# Patient Record
Sex: Female | Born: 1985
Health system: Southern US, Community
[De-identification: ages and names within clinical notes are randomized; demographics above are authoritative.]

## PROBLEM LIST (undated history)

## (undated) ENCOUNTER — Inpatient Hospital Stay (HOSPITAL_COMMUNITY): Payer: Self-pay

## (undated) DIAGNOSIS — Z789 Other specified health status: Secondary | ICD-10-CM

## (undated) DIAGNOSIS — E7800 Pure hypercholesterolemia, unspecified: Secondary | ICD-10-CM

## (undated) DIAGNOSIS — I10 Essential (primary) hypertension: Secondary | ICD-10-CM

---
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
## 2006-06-02 ENCOUNTER — Emergency Department (HOSPITAL_COMMUNITY): Admission: EM | Admit: 2006-06-02 | Discharge: 2006-06-02 | Payer: Self-pay | Admitting: Emergency Medicine

## 2006-08-08 ENCOUNTER — Emergency Department (HOSPITAL_COMMUNITY): Admission: EM | Admit: 2006-08-08 | Discharge: 2006-08-08 | Payer: Self-pay | Admitting: Emergency Medicine

## 2006-08-21 ENCOUNTER — Emergency Department (HOSPITAL_COMMUNITY): Admission: EM | Admit: 2006-08-21 | Discharge: 2006-08-21 | Payer: Self-pay | Admitting: Emergency Medicine

## 2007-02-17 ENCOUNTER — Inpatient Hospital Stay (HOSPITAL_COMMUNITY): Admission: AD | Admit: 2007-02-17 | Discharge: 2007-02-17 | Payer: Self-pay | Admitting: Obstetrics and Gynecology

## 2007-04-10 ENCOUNTER — Inpatient Hospital Stay (HOSPITAL_COMMUNITY): Admission: AD | Admit: 2007-04-10 | Discharge: 2007-04-10 | Payer: Self-pay | Admitting: Obstetrics and Gynecology

## 2007-04-14 ENCOUNTER — Inpatient Hospital Stay (HOSPITAL_COMMUNITY): Admission: AD | Admit: 2007-04-14 | Discharge: 2007-04-14 | Payer: Self-pay | Admitting: Obstetrics and Gynecology

## 2007-04-14 ENCOUNTER — Ambulatory Visit: Payer: Self-pay | Admitting: Obstetrics & Gynecology

## 2007-04-15 ENCOUNTER — Inpatient Hospital Stay (HOSPITAL_COMMUNITY): Admission: AD | Admit: 2007-04-15 | Discharge: 2007-04-16 | Payer: Self-pay | Admitting: Obstetrics and Gynecology

## 2007-04-18 ENCOUNTER — Ambulatory Visit: Payer: Self-pay | Admitting: Obstetrics and Gynecology

## 2007-04-21 ENCOUNTER — Ambulatory Visit: Payer: Self-pay | Admitting: Family Medicine

## 2007-04-22 ENCOUNTER — Inpatient Hospital Stay (HOSPITAL_COMMUNITY): Admission: AD | Admit: 2007-04-22 | Discharge: 2007-04-22 | Payer: Self-pay | Admitting: Obstetrics and Gynecology

## 2007-04-24 ENCOUNTER — Ambulatory Visit: Payer: Self-pay | Admitting: Family Medicine

## 2007-04-24 ENCOUNTER — Inpatient Hospital Stay (HOSPITAL_COMMUNITY): Admission: AD | Admit: 2007-04-24 | Discharge: 2007-04-24 | Payer: Self-pay | Admitting: Obstetrics and Gynecology

## 2007-04-25 ENCOUNTER — Inpatient Hospital Stay (HOSPITAL_COMMUNITY): Admission: AD | Admit: 2007-04-25 | Discharge: 2007-04-30 | Payer: Self-pay | Admitting: Obstetrics and Gynecology

## 2007-04-25 ENCOUNTER — Encounter: Payer: Self-pay | Admitting: Obstetrics and Gynecology

## 2007-04-28 ENCOUNTER — Encounter (INDEPENDENT_AMBULATORY_CARE_PROVIDER_SITE_OTHER): Payer: Self-pay | Admitting: Obstetrics and Gynecology

## 2007-05-01 ENCOUNTER — Inpatient Hospital Stay (HOSPITAL_COMMUNITY): Admission: AD | Admit: 2007-05-01 | Discharge: 2007-05-04 | Payer: Self-pay | Admitting: Obstetrics and Gynecology

## 2008-10-27 ENCOUNTER — Emergency Department (HOSPITAL_COMMUNITY): Admission: EM | Admit: 2008-10-27 | Discharge: 2008-10-27 | Payer: Self-pay | Admitting: Family Medicine

## 2009-05-02 ENCOUNTER — Emergency Department (HOSPITAL_COMMUNITY): Admission: EM | Admit: 2009-05-02 | Discharge: 2009-05-02 | Payer: Self-pay | Admitting: Emergency Medicine

## 2009-06-17 ENCOUNTER — Emergency Department (HOSPITAL_COMMUNITY): Admission: EM | Admit: 2009-06-17 | Discharge: 2009-06-17 | Payer: Self-pay | Admitting: Emergency Medicine

## 2010-12-30 ENCOUNTER — Emergency Department (HOSPITAL_COMMUNITY): Payer: Self-pay

## 2010-12-30 ENCOUNTER — Emergency Department (HOSPITAL_COMMUNITY)
Admission: EM | Admit: 2010-12-30 | Discharge: 2010-12-30 | Disposition: A | Discharge status: Home / Self Care | Payer: Self-pay | Attending: Emergency Medicine | Admitting: Emergency Medicine

## 2010-12-30 DIAGNOSIS — R209 Unspecified disturbances of skin sensation: Secondary | ICD-10-CM | POA: Insufficient documentation

## 2010-12-30 DIAGNOSIS — R071 Chest pain on breathing: Secondary | ICD-10-CM | POA: Insufficient documentation

## 2010-12-30 DIAGNOSIS — Z79899 Other long term (current) drug therapy: Secondary | ICD-10-CM | POA: Insufficient documentation

## 2010-12-30 LAB — URINALYSIS, ROUTINE W REFLEX MICROSCOPIC
Bilirubin Urine: NEGATIVE
Hgb urine dipstick: NEGATIVE
Ketones, ur: NEGATIVE mg/dL
Nitrite: NEGATIVE
Protein, ur: NEGATIVE mg/dL
Specific Gravity, Urine: 1.025 (ref 1.005–1.030)
Urine Glucose, Fasting: NEGATIVE mg/dL
Urobilinogen, UA: 1 mg/dL (ref 0.0–1.0)
pH: 6.5 (ref 5.0–8.0)

## 2010-12-30 LAB — POCT CARDIAC MARKERS
CKMB, poc: <1 ng/mL — ABNORMAL LOW (ref 1.0–8.0)
Myoglobin, poc: 28.8 ng/mL (ref 12–200)
Troponin i, poc: <0.05 ng/mL (ref 0.00–0.09)

## 2010-12-30 LAB — POCT I-STAT, CHEM 8
BUN: 14 mg/dL (ref 6–23)
Calcium, Ion: 1.12 mmol/L (ref 1.12–1.32)
Chloride: 107 mEq/L (ref 96–112)
Creatinine, Ser: 1 mg/dL (ref 0.4–1.2)
Glucose, Bld: 101 mg/dL — ABNORMAL HIGH (ref 70–99)
HCT: 37 % (ref 36.0–46.0)
Hemoglobin: 12.6 g/dL (ref 12.0–15.0)
Potassium: 3.7 mEq/L (ref 3.5–5.1)
Sodium: 139 mEq/L (ref 135–145)
TCO2: 22 mmol/L (ref 0–100)

## 2010-12-30 LAB — POCT PREGNANCY, URINE: Preg Test, Ur: NEGATIVE

## 2011-03-16 NOTE — H&P (Signed)
[name], [name]            ACCOUNT NO.:  [id number]   MEDICAL RECORD NO.:  [id number]          PATIENT TYPE:  MAT   LOCATION:  [facility]                          FACILITY:  [facility]   PHYSICIAN:  Malachi Pro. Ambrose Mantle, M.D. DATE OF BIRTH:  11-04-1985   DATE OF ADMISSION:  05/01/2007  DATE OF DISCHARGE:                              HISTORY & PHYSICAL   This is a 25 year old black female, para 0-1-0-2, who is rea-admitted to  the hospital one day after discharge, because of preeclampsia.  The  patient was admitted on April 24, 2007, antepartum for preeclampsia.  She  developed a severe headache, and her labor was induced on April 28, 2007,  and she delivered the twin babies vaginally.  Postpartum, her labs  improved.  Her blood pressures remained acceptable, and she was  discharged on the second postpartum day.  She was discharged on  labetalol 300 mg twice a day.  She began to have a right retro-orbital  headache on May 01, 2007, and her blood pressure was found to be over  200 systolic and over 120 diastolic.  She came to our office, where her  blood pressure was about 180/120, and she was sent to the maternity  admission unit for continued evaluation.  In the maternity admission  unit, her blood pressures actually improved.  Her pressures were  183/102, 162/87, 150/81, 150/84 and 155/89, and after having taken a  Percocet before she came to our office, her headache has been resolved;  however, the reason she is admitted is because her laboratory values  have deteriorated.  Her SGOT and SGPT have elevated to 79 and 60  respectively.  Her uric acid is 6.0.  BUN is 4, creatinine 0.7.  Urinalysis shows greater than 300 mg percent protein.  Her platelet  count is actually improved at 148,000.   PAST MEDICAL HISTORY:  REVEALS NO KNOWN ALLERGIES.  No operations, no  serious adult illnesses.  She has been treated for chlamydia and  gonorrhea in the past.   FAMILY HISTORY:  Her maternal  grandmother has high blood pressure and  diabetes.  Her mother also has high blood pressure.   HABITS:  Alcohol, tobacco and drugs:  None.   MEDICATIONS:  On admission were:  1. Motrin 600 every 6 hours as needed.  2. Percocet 1 or 2 tablets every 4 hours as needed.  3. Labetalol 300 mg twice a day.  4. Zoloft 50 mg at h.s., if the patient felt it was necessary.   PHYSICAL EXAMINATION:  GENERAL:  Well-developed, well-nourished black  female in no acute distress at this time.  VITAL SIGNS:  Most recent blood pressure 155/89, pulse 81.  Her  temperature was noted to be 100.2 at 1348 hours.  This will be watched.  EYES, EARS, NOSE AND THROAT:  Normal.  HEART:  Normal size and sounds, no murmurs.  LUNGS:  Clear to auscultation.  ABDOMEN:  Soft, flat, not tender.  PELVIC:  Deferred.  Deep tendon reflexes are 2+.   ADMITTING IMPRESSION:  Preeclampsia 3 days postpartum and a twin  pregnancy complicated by preeclampsia.  The patient has 300 mg percent  protein, elevated liver function tests, and a history of a severe  headache.  She is admitted for intravenous magnesium sulfate.      Malachi Pro. Ambrose Mantle, M.D.  Electronically Signed     TFH/MEDQ  D:  05/01/2007  T:  05/01/2007  Job:  [id number]

## 2011-03-16 NOTE — Discharge Summary (Signed)
[name], [name]            ACCOUNT NO.:  [id number]   MEDICAL RECORD NO.:  [id number]          PATIENT TYPE:  INP   LOCATION:                                [facility]  [facility]   PHYSICIAN:  Huel Cote, M.D. DATE OF BIRTH:  [birth year]   DATE OF ADMISSION:  04/24/2007  DATE OF DISCHARGE:  04/30/2007                               DISCHARGE SUMMARY   DISCHARGE DIAGNOSES:  1. Preterm delivery of twins at [redacted] weeks gestation.  2. Preeclampsia.  3. Status post normal spontaneous vaginal delivery x2.  4. Some anxiety in the hospital.   DISCHARGE MEDICATIONS:  1. Motrin 600 mg p.o. q.6 hours.  2. Percocet 1-2 tablets p.o. q.4 hours p.r.n.  3. Labetalol 300 mg p.o. b.i.d.  4. Zoloft 50 mg p.o. q.h.s. which patient will take if she feels      necessary.   DISCHARGE FOLLOWUP:  The patient is to follow in the office in  approximately 4 days for a blood pressure check on May 04, 2007.  Her  babies will remain in the NICU.  However are doing excellently at  present.   HOSPITAL COURSE:  The patient is a 25 year old G1, P0 who came in at 80-  4/7 week's gestation with a twin pregnancy and mild preeclampsia. The  patient had early dating at 8 weeks and 5 days with an estimated due  date of June 16, 2007.  Her parental care was significant for the twin  pregnancy and some mild discordance in growth, approximately 17%  difference in the last ultrasound performed shortly before admission.  Beginning approximately 1 week prior to admission the patient began to  have some elevated blood pressures with diastolic's in the 90's.  She  did undergo 24 hour urine testing which showed 450 mg of protein in 24  hours, consistent with mild preeclampsia.  Her labs remained normal.  However given her elevated blood pressures and abnormal proteinuria, it  was felt that maybe she should be admitted for observation and this was  done.  She did get betamethasone x2 and had a maternal fetal medicine  consult on admission.  They felt that she should be delivered should her  preeclampsia worsen.   On hospital day number 3 the patient woke up with a significant headache  which was not relived with Oxycodone.  Her blood pressure began to  elevate significantly to 180/110.  At this point it was felt that she  should proceed with delivery given that she was now symptomatic.  Her  labs revealed a normal liver function test except for a mildly elevated  SGOT of 44.  Her uric acid was 5. Platelets were 128,000 which was down  from 160,000.  No other abnormalities were noted.  At that time she was  examined and cervix was found to be 50, 1+ and a -2 station.  She had  rupture of membranes of twin A performed and was given some IV labetalol  for her blood pressure as needed.  She was placed on pitocin and  magnesium for seizure prophylaxis.  Her blood pressure stayed elevated  but remained in the 150's over 90's range on the magnesium.  There was a  fetal scalp electrode applied to baby A for close monitoring and both  babies appeared reassuring.  She then continued to make good progress  and reached complete dilation rather quickly in the afternoon.  She  pushed very well, baby A delivered vertex.  Apgar's were 9 and 9, weight  was 3 pounds, 14 ounces, a viable boy.  Cord was clamped and ultrasound  was utilized and an exam felt and baby B was then noted to be vertex as  well.  It was still rather high in the vagina therefore several  contractions were allowed to descend the vertex into the vagina.  After  the vertex was lower in the vagina, rupture of membranes was performed  with clear fluid noted.  The patient then pushed the baby out in  approximately 10 minutes.  Apgar's were 9 and 9, weight was 4 pounds, 4  ounces.  Placenta's were then delivered spontaneously after cord blood  was obtained from each cord and marked appropriately.  The patient had a  small first degree laceration which  was repaired with 3-0 Vicryl for  hemostasis.  Estimated blood loss was 400 cc.  The babies were taken to  the NICU given their early gestation, however were breathing in room air  and appeared quite vigorous and were doing well.   On postpartum day number 2 she was doing well, she was ambulating with  minimal pain.  Blood pressures were 137 to 77 over 150 to 101 on  labetalol  200 b.i.d.  She was diuresing some, however had not really  begun to diurese as well as was expected and therefore was left on the  magnesium for slightly over 24 hours.  Her platelets were 109,000 the  morning following delivery.  By that evening they were repeated and  found to be stable at 110,000.  On the evening of postpartum day number  1 the patient had an episode of increased anxiety and a mild panic  attack which she felt was related to being in the hospital over a  prolonged course and lack of sleep.  By the morning of postpartum day  number 2 she felt fine, had no other complaints and her pain was well  controlled.  At this point she was diuresing quite well and was a  negative 3200 over the last shift of measurement.  She had lost 36  pounds from her admission weight.  Her blood pressures were 150's to  170's over mostly 90's on the labetalol 200 b.i.d.  Therefore this was  increased to 300 b.i.d.  Platelets were stable at 115,000.  Hemoglobin  at discharge was 8.4.  Discussion was had with the patient regarding  discharge and her increased blood pressures and the need to go home on  blood pressure medicine.  She understands this and will continue her  labetalol 300 b.i.d. and return to the office in 4 days for a blood  pressure check.  We also discussed her anxiety of the night before and  any risk for postpartum depression.  She felt that she was currently  stable but would agree to take a prescription for Zoloft home with her  and begin this as needed if her symptoms should worsen again.  She was   given 50 mg p.o. q.h.s.  Again, the patient will followup in the office  in 4 days for a blood pressure  check and will continue her blood  pressure medicines until then.  She was given her pain medicines as  stated and was felt stable for discharge home.      Huel Cote, M.D.  Electronically Signed     KR/MEDQ  D:  04/30/2007  T:  04/30/2007  Job:  [id number]

## 2011-03-16 NOTE — H&P (Signed)
[name], [name]            ACCOUNT NO.:  [id number]   MEDICAL RECORD NO.:  [id number]          PATIENT TYPE:  INP   LOCATION:                                [facility]   PHYSICIAN:  Malachi Pro. Ambrose Mantle, M.D. DATE OF BIRTH:  04-May-1986   DATE OF ADMISSION:  DATE OF DISCHARGE:                              HISTORY & PHYSICAL   HISTORY OF PRESENT ILLNESS:  This is a 25 year old black female, para 0,  gravida 1, EDC June 16, 2007 by the ultrasound at 8 weeks and 5 days,  admitted with preeclampsia.  This patient had a vaginal ultrasound on  November 09, 2006 at which time she was 8 weeks and 5 days, giving her  due date of June 16, 2007, which puts her at 32 weeks and 4 days  today.  The patient's blood group and type is A positive with a negative  antibody, sickle cell negative, RPR nonreactive, rubella immune,  hepatitis B surface antigen negative, HIV negative, GC and Chlamydia  negative.  Cystic fibrosis negative.  One-hour Glucola test 167, 3-hour  GTT 83, 191, 137, and 77.  The patient was treated with Macrobid for  urinary tract infection secondary to E coli on December 12, 2006.  Repeat ultrasound on January 18, 2007 showed average gestational age of  both twins at 56 weeks 6 days.  On February 28, 2007, the size difference  was 15+%.  A fetal fibronectin test on February 17, 2007 was positive.  I  spoke to Dr. Rachel Bo.  She advised feeding the ultrasound in two weeks.  She wanted to evaluate the fluid, watch for growth and begin nonstress  tests at 32 weeks.  If the fluid or the size was less than the 10th  percentile, she advised starting testing earlier. On Mar 17, 2007, the  estimated size of the two infants was 2 pounds 5 ounces and 2 pounds 10  ounces, which was less than a 12% difference.  The fluid volume around  both babies was normal.  On April 10, 2007, the patient had repeat  ultrasound and the babies' sizes were estimated at 3 pounds 3 ounces and  3 pounds 13 ounces.   There was a slight restriction in baby A's  abdominal circumference  and Dr. Ander Slade was consulted and advised repeating  the ultrasound in two weeks.  There was a 17% discrepancy in the size of  the babies.  Beginning on Mar 21, 2007, the patient's blood pressure  rose to 140/90. Subsequently had been 130/90, 146/90, and then on April 24, 2007 about 150/102.  The patient did undergo 24-hour urine testing  that was completed on April 24, 2007.  The results showed a creatinine  clearance of 161 mL a minute and protein was 450 mg in 24 hours.  The  patient has gained 30 pounds in the last six weeks.  Nonstress tests on  April 24, 2007 were nonreactive but biophysical profiles on both babies  was otherwise completely normal.   I spoke to Dr. Margot Ables and he felt that mild preeclampsia could be  managed as  an outpatient.  However, given the patient's young age, twin  pregnancy and rapid weight gain over the last six weeks, he advised  inpatient management.   PAST MEDICAL HISTORY:  No known allergies, no operations, no serious  adult illnesses.  The patient has been treated for Chlamydia and  gonorrhea in the past.   FAMILY HISTORY:  Her maternal grandmother has high blood pressure and  diabetes.  Her mother also has high blood pressure.   SOCIAL HISTORY:  Alcohol, tobacco and drugs:  None.   PHYSICAL EXAMINATION:  GENERAL:  Well-developed, well-nourished, black  female, in no distress.  VITAL SIGNS:  Weight on April 24, 2007 was 236 pounds.  Blood pressure  142/86 in the office, 152/100 at the hospital that later came down  toward 90 diastolic.  HEENT:  Somewhat swollen facies.  LUNGS:  Clear to auscultation.  HEART:  Normal size and sounds.  ABDOMEN:  Soft.  Fundal height 44.5 cm.  Fetal heart tones were normal.  Cervix was not examined.   On April 24, 2007 the patient was sent to the hospital to get her first  steroid injection since delivery might need to be done prior to 34  weeks.  The  patient is admitted now for inpatient management of her twin  gestation with preeclampsia.   ADMITTING IMPRESSION:  1. Intrauterine pregnancy at 32 weeks 4 or 5 days.  2. Slightly discordant fetal growth.  3. Preclampsia.   The patient is admitted for bed rest with bathroom privileges, daily  weights, continuous fetal monitoring, and consultation with [facility] Perinatologists.      Malachi Pro. Ambrose Mantle, M.D.  Electronically Signed     TFH/MEDQ  D:  04/25/2007  T:  04/25/2007  Job:  [id number]

## 2011-03-19 NOTE — Discharge Summary (Signed)
[name], Sierra            ACCOUNT NO.:  [id number]   MEDICAL RECORD NO.:  [id number]          PATIENT TYPE:  INP   LOCATION:  9157                          [facility]ACILITY:  [facility]   PHYSICIAN:  Huel Cote, M.D. DATE OF BIRTH:  [birth year]   DATE OF ADMISSION:  05/01/2007  DATE OF DISCHARGE:  05/04/2007                               DISCHARGE SUMMARY   DISCHARGE DIAGNOSES:  Postpartum twin pregnancy, delivered for  preeclampsia on April 28, 2007, discharged to home stable and returned  with increasing blood pressures, headache and need for residual  postpartum magnesium.   DISCHARGE MEDICATIONS:  1. Vasotec twice daily.  2. Labetalol twice daily.   DISCHARGE FOLLOWUP:  The patient is to follow up in the office in  approximately 1 week for a blood pressure check.   HOSPITAL COURSE:  The patient is a 25 year old black female who was  readmitted for a persistent preeclampsia, given that she was postpartum  and had an increase in her headaches and blood pressure at home.  She  had been delivered on June 27 and had received postpartum magnesium.  She did require blood pressure management with labetalol 300 mg twice  daily, on which she was discharged home.  After discharge, 2-3 days  later, she began to develop a significant headache and was seen in the  office, where her blood pressure was noted to be 180/120.  She was also  sent for laboratory evaluation and was noted to have elevated LFTs to 79  and 60 and her urine demonstrated 300 mg of protein, consistent with  persistent preeclampsia; she was therefore admitted and placed on  magnesium. Shortly after admission, she began to improve; however, she  did require the addition of Vasotec to control her blood pressures.  On  300 mg twice daily of labetalol and Vasotec twice daily, her blood  pressures eventually normalized.  She was continued on her magnesium  until May 03, 2007 and in the evening, her blood pressures had  improved,  her liver functions were normalizing and her platelets were stable at  177,000.  By hospital day #4, she was feeling well with no headache or  other symptoms, her blood pressures were down to 140-150 over 70s to 90s  with her medications and her liver functions had normalized to 29 and  30.  Given that she was stable off her magnesium, it was felt she was  stable for discharge home.  She was discharged on her blood pressure  medicines and we will be following her blood pressure carefully in the  office within 1 week of discharge.   DISCHARGE SUMMARY:  On the patient, and daily thank you      Huel Cote, M.D.  Electronically Signed     KR/MEDQ  D:  07/07/2007  T:  07/07/2007  Job:  [id number]

## 2011-07-18 ENCOUNTER — Emergency Department (HOSPITAL_COMMUNITY): Payer: Self-pay

## 2011-07-18 ENCOUNTER — Emergency Department (HOSPITAL_COMMUNITY)
Admission: EM | Admit: 2011-07-18 | Discharge: 2011-07-18 | Disposition: A | Discharge status: Home / Self Care | Payer: Self-pay | Attending: Emergency Medicine | Admitting: Emergency Medicine

## 2011-07-18 DIAGNOSIS — S0003XA Contusion of scalp, initial encounter: Secondary | ICD-10-CM | POA: Insufficient documentation

## 2011-07-18 DIAGNOSIS — E7800 Pure hypercholesterolemia, unspecified: Secondary | ICD-10-CM | POA: Insufficient documentation

## 2011-07-18 DIAGNOSIS — Z9109 Other allergy status, other than to drugs and biological substances: Secondary | ICD-10-CM | POA: Insufficient documentation

## 2011-07-18 DIAGNOSIS — R071 Chest pain on breathing: Secondary | ICD-10-CM | POA: Insufficient documentation

## 2011-08-06 LAB — POCT URINALYSIS DIP (DEVICE)
Bilirubin Urine: NEGATIVE
Glucose, UA: NEGATIVE mg/dL
Hgb urine dipstick: NEGATIVE
Ketones, ur: NEGATIVE mg/dL
Nitrite: NEGATIVE
Protein, ur: 30 mg/dL — AB
Specific Gravity, Urine: 1.025 (ref 1.005–1.030)
Urobilinogen, UA: 0.2 mg/dL (ref 0.0–1.0)
pH: 5.5 (ref 5.0–8.0)

## 2011-08-06 LAB — POCT PREGNANCY, URINE: Preg Test, Ur: NEGATIVE

## 2011-08-17 LAB — COMPREHENSIVE METABOLIC PANEL
ALT: 33
ALT: 44 — ABNORMAL HIGH
ALT: 62 — ABNORMAL HIGH
AST: 29
AST: 37
AST: 65 — ABNORMAL HIGH
Albumin: 2.5 — ABNORMAL LOW
Albumin: 2.6 — ABNORMAL LOW
Albumin: 2.6 — ABNORMAL LOW
Alkaline Phosphatase: 107
Alkaline Phosphatase: 112
Alkaline Phosphatase: 118 — ABNORMAL HIGH
BUN: 3 — ABNORMAL LOW
BUN: 7
BUN: 7
CO2: 24
CO2: 25
CO2: 26
Calcium: 7.6 — ABNORMAL LOW
Calcium: 8 — ABNORMAL LOW
Calcium: 8.5
Chloride: 102
Chloride: 105
Chloride: 107
Creatinine, Ser: 0.65
Creatinine, Ser: 0.66
Creatinine, Ser: 0.71
GFR calc Af Amer: >60
GFR calc Af Amer: >60
GFR calc Af Amer: >60
GFR calc non Af Amer: >60
GFR calc non Af Amer: >60
GFR calc non Af Amer: >60
Glucose, Bld: 84
Glucose, Bld: 85
Glucose, Bld: 89
Potassium: 4
Potassium: 4.1
Potassium: 4.1
Sodium: 136
Sodium: 137
Sodium: 138
Total Bilirubin: 0.8
Total Bilirubin: 0.8
Total Bilirubin: 1
Total Protein: 4.9 — ABNORMAL LOW
Total Protein: 5.6 — ABNORMAL LOW
Total Protein: 5.9 — ABNORMAL LOW

## 2011-08-17 LAB — LACTATE DEHYDROGENASE
LDH: 340 — ABNORMAL HIGH
LDH: 342 — ABNORMAL HIGH
LDH: 348 — ABNORMAL HIGH

## 2011-08-17 LAB — CBC
HCT: 29.7 — ABNORMAL LOW
HCT: 31.1 — ABNORMAL LOW
HCT: 32.9 — ABNORMAL LOW
Hemoglobin: 10.2 — ABNORMAL LOW
Hemoglobin: 10.6 — ABNORMAL LOW
Hemoglobin: 10.9 — ABNORMAL LOW
MCHC: 33.1
MCHC: 34
MCHC: 34.3
MCV: 90.4
MCV: 90.6
MCV: 91.4
Platelets: 172
Platelets: 177
Platelets: 207
RBC: 3.28 — ABNORMAL LOW
RBC: 3.41 — ABNORMAL LOW
RBC: 3.64 — ABNORMAL LOW
RDW: 13.4
RDW: 13.8
RDW: 13.9
WBC: 13.9 — ABNORMAL HIGH
WBC: 14.9 — ABNORMAL HIGH
WBC: 15.3 — ABNORMAL HIGH

## 2011-08-17 LAB — URIC ACID
Uric Acid, Serum: 6.3
Uric Acid, Serum: 6.7
Uric Acid, Serum: 7.2 — ABNORMAL HIGH

## 2011-08-17 LAB — MAGNESIUM: Magnesium: 4.7 — ABNORMAL HIGH

## 2011-08-18 LAB — COMPREHENSIVE METABOLIC PANEL
ALT: 10
ALT: 13
ALT: 14
ALT: 25
ALT: 25
ALT: 25
ALT: 33
ALT: 60 — ABNORMAL HIGH
AST: 26
AST: 27
AST: 31
AST: 32
AST: 35
AST: 44 — ABNORMAL HIGH
AST: 54 — ABNORMAL HIGH
AST: 79 — ABNORMAL HIGH
Albumin: 1.8 — ABNORMAL LOW
Albumin: 2.1 — ABNORMAL LOW
Albumin: 2.2 — ABNORMAL LOW
Albumin: 2.3 — ABNORMAL LOW
Albumin: 2.4 — ABNORMAL LOW
Albumin: 2.5 — ABNORMAL LOW
Albumin: 2.5 — ABNORMAL LOW
Albumin: 2.6 — ABNORMAL LOW
Alkaline Phosphatase: 110
Alkaline Phosphatase: 116
Alkaline Phosphatase: 117
Alkaline Phosphatase: 122 — ABNORMAL HIGH
Alkaline Phosphatase: 127 — ABNORMAL HIGH
Alkaline Phosphatase: 85
Alkaline Phosphatase: 88
Alkaline Phosphatase: 99
BUN: 4 — ABNORMAL LOW
BUN: 5 — ABNORMAL LOW
BUN: 6
BUN: 7
BUN: 7
BUN: 9
BUN: 9
BUN: 9
CO2: 21
CO2: 21
CO2: 22
CO2: 22
CO2: 24
CO2: 25
CO2: 25
CO2: 27
Calcium: 7 — ABNORMAL LOW
Calcium: 7.5 — ABNORMAL LOW
Calcium: 8.4
Calcium: 8.5
Calcium: 8.6
Calcium: 8.6
Calcium: 8.7
Calcium: 9.3
Chloride: 106
Chloride: 108
Chloride: 108
Chloride: 108
Chloride: 109
Chloride: 109
Chloride: 109
Chloride: 111
Creatinine, Ser: 0.64
Creatinine, Ser: 0.65
Creatinine, Ser: 0.65
Creatinine, Ser: 0.67
Creatinine, Ser: 0.68
Creatinine, Ser: 0.7
Creatinine, Ser: 0.7
Creatinine, Ser: 0.72
GFR calc Af Amer: >60
GFR calc Af Amer: >60
GFR calc Af Amer: >60
GFR calc Af Amer: >60
GFR calc Af Amer: >60
GFR calc Af Amer: >60
GFR calc Af Amer: >60
GFR calc Af Amer: >60
GFR calc non Af Amer: >60
GFR calc non Af Amer: >60
GFR calc non Af Amer: >60
GFR calc non Af Amer: >60
GFR calc non Af Amer: >60
GFR calc non Af Amer: >60
GFR calc non Af Amer: >60
GFR calc non Af Amer: >60
Glucose, Bld: 101 — ABNORMAL HIGH
Glucose, Bld: 107 — ABNORMAL HIGH
Glucose, Bld: 80
Glucose, Bld: 81
Glucose, Bld: 82
Glucose, Bld: 85
Glucose, Bld: 88
Glucose, Bld: 93
Potassium: 3.8
Potassium: 3.8
Potassium: 3.9
Potassium: 3.9
Potassium: 4
Potassium: 4
Potassium: 4.1
Potassium: 4.2
Sodium: 136
Sodium: 136
Sodium: 137
Sodium: 137
Sodium: 139
Sodium: 139
Sodium: 140
Sodium: 140
Total Bilirubin: 0.3
Total Bilirubin: 0.4
Total Bilirubin: 0.5
Total Bilirubin: 0.6
Total Bilirubin: 0.6
Total Bilirubin: 0.7
Total Bilirubin: 0.8
Total Bilirubin: 0.8
Total Protein: 4 — ABNORMAL LOW
Total Protein: 4.4 — ABNORMAL LOW
Total Protein: 4.5 — ABNORMAL LOW
Total Protein: 5.3 — ABNORMAL LOW
Total Protein: 5.3 — ABNORMAL LOW
Total Protein: 5.3 — ABNORMAL LOW
Total Protein: 5.7 — ABNORMAL LOW
Total Protein: 5.9 — ABNORMAL LOW

## 2011-08-18 LAB — CBC
HCT: 24.5 — ABNORMAL LOW
HCT: 25.3 — ABNORMAL LOW
HCT: 26.3 — ABNORMAL LOW
HCT: 29.1 — ABNORMAL LOW
HCT: 29.8 — ABNORMAL LOW
HCT: 32 — ABNORMAL LOW
HCT: 32.7 — ABNORMAL LOW
HCT: 32.8 — ABNORMAL LOW
HCT: 33.2 — ABNORMAL LOW
HCT: 35.2 — ABNORMAL LOW
Hemoglobin: 10.1 — ABNORMAL LOW
Hemoglobin: 11 — ABNORMAL LOW
Hemoglobin: 11.1 — ABNORMAL LOW
Hemoglobin: 11.3 — ABNORMAL LOW
Hemoglobin: 11.4 — ABNORMAL LOW
Hemoglobin: 12
Hemoglobin: 8.2 — ABNORMAL LOW
Hemoglobin: 8.4 — ABNORMAL LOW
Hemoglobin: 8.8 — ABNORMAL LOW
Hemoglobin: 9.7 — ABNORMAL LOW
MCHC: 33.2
MCHC: 33.3
MCHC: 33.7
MCHC: 33.7
MCHC: 33.8
MCHC: 33.9
MCHC: 34
MCHC: 34.4
MCHC: 34.4
MCHC: 34.6
MCV: 88.5
MCV: 88.5
MCV: 88.8
MCV: 88.9
MCV: 89.7
MCV: 90.3
MCV: 90.7
MCV: 90.7
MCV: 91.3
MCV: 91.6
Platelets: 109 — ABNORMAL LOW
Platelets: 110 — ABNORMAL LOW
Platelets: 115 — ABNORMAL LOW
Platelets: 125 — ABNORMAL LOW
Platelets: 127 — ABNORMAL LOW
Platelets: 128 — ABNORMAL LOW
Platelets: 141 — ABNORMAL LOW
Platelets: 143 — ABNORMAL LOW
Platelets: 148 — ABNORMAL LOW
Platelets: 163
RBC: 2.7 — ABNORMAL LOW
RBC: 2.76 — ABNORMAL LOW
RBC: 2.91 — ABNORMAL LOW
RBC: 3.19 — ABNORMAL LOW
RBC: 3.28 — ABNORMAL LOW
RBC: 3.6 — ABNORMAL LOW
RBC: 3.65 — ABNORMAL LOW
RBC: 3.7 — ABNORMAL LOW
RBC: 3.76 — ABNORMAL LOW
RBC: 3.96
RDW: 12.9
RDW: 13
RDW: 13.1
RDW: 13.1
RDW: 13.1
RDW: 13.1
RDW: 13.1
RDW: 13.4
RDW: 13.6
RDW: 13.9
WBC: 10.3
WBC: 10.4
WBC: 10.6 — ABNORMAL HIGH
WBC: 11.9 — ABNORMAL HIGH
WBC: 12.2 — ABNORMAL HIGH
WBC: 12.9 — ABNORMAL HIGH
WBC: 13.4 — ABNORMAL HIGH
WBC: 14 — ABNORMAL HIGH
WBC: 8.7
WBC: 9.6

## 2011-08-18 LAB — URINALYSIS, DIPSTICK ONLY
Bilirubin Urine: NEGATIVE
Bilirubin Urine: NEGATIVE
Glucose, UA: NEGATIVE
Glucose, UA: NEGATIVE
Hgb urine dipstick: NEGATIVE
Hgb urine dipstick: NEGATIVE
Ketones, ur: NEGATIVE
Ketones, ur: NEGATIVE
Leukocytes, UA: NEGATIVE
Leukocytes, UA: NEGATIVE
Nitrite: NEGATIVE
Nitrite: NEGATIVE
Protein, ur: 100 — AB
Protein, ur: 100 — AB
Specific Gravity, Urine: 1.025
Specific Gravity, Urine: >1.03 — ABNORMAL HIGH
Urobilinogen, UA: 0.2
Urobilinogen, UA: 0.2
pH: 6
pH: 7

## 2011-08-18 LAB — URINALYSIS, ROUTINE W REFLEX MICROSCOPIC
Bilirubin Urine: NEGATIVE
Glucose, UA: NEGATIVE
Glucose, UA: NEGATIVE
Hgb urine dipstick: NEGATIVE
Ketones, ur: 15 — AB
Ketones, ur: NEGATIVE
Leukocytes, UA: NEGATIVE
Nitrite: NEGATIVE
Nitrite: NEGATIVE
Protein, ur: 100 — AB
Protein, ur: >300 — AB
Specific Gravity, Urine: 1.025
Specific Gravity, Urine: >1.03 — ABNORMAL HIGH
Urobilinogen, UA: 1
Urobilinogen, UA: 1
pH: 6
pH: 7

## 2011-08-18 LAB — URINE MICROSCOPIC-ADD ON

## 2011-08-18 LAB — LACTATE DEHYDROGENASE
LDH: 172
LDH: 199
LDH: 206
LDH: 259 — ABNORMAL HIGH
LDH: 263 — ABNORMAL HIGH
LDH: 285 — ABNORMAL HIGH
LDH: 294 — ABNORMAL HIGH
LDH: 355 — ABNORMAL HIGH

## 2011-08-18 LAB — URIC ACID
Uric Acid, Serum: 5
Uric Acid, Serum: 5
Uric Acid, Serum: 5.1
Uric Acid, Serum: 5.1
Uric Acid, Serum: 5.3
Uric Acid, Serum: 5.4
Uric Acid, Serum: 6
Uric Acid, Serum: 6

## 2011-08-18 LAB — MAGNESIUM: Magnesium: 3.6 — ABNORMAL HIGH

## 2011-08-19 LAB — CBC
HCT: 34.5 — ABNORMAL LOW
HCT: 34.9 — ABNORMAL LOW
Hemoglobin: 11.5 — ABNORMAL LOW
Hemoglobin: 11.9 — ABNORMAL LOW
MCHC: 33.3
MCHC: 34
MCV: 89
MCV: 90.4
Platelets: 191
Platelets: 206
RBC: 3.82 — ABNORMAL LOW
RBC: 3.92
RDW: 12.6
RDW: 13.2
WBC: 9.4
WBC: 9.5

## 2011-08-19 LAB — LACTATE DEHYDROGENASE
LDH: 134
LDH: 138

## 2011-08-19 LAB — COMPREHENSIVE METABOLIC PANEL
ALT: 15
ALT: 9
AST: 19
AST: 23
Albumin: 2.4 — ABNORMAL LOW
Albumin: 2.5 — ABNORMAL LOW
Alkaline Phosphatase: 128 — ABNORMAL HIGH
Alkaline Phosphatase: 138 — ABNORMAL HIGH
BUN: 6
BUN: 7
CO2: 22
CO2: 23
Calcium: 9.2
Calcium: 9.3
Chloride: 106
Chloride: 106
Creatinine, Ser: 0.59
Creatinine, Ser: 0.6
GFR calc Af Amer: >60
GFR calc Af Amer: >60
GFR calc non Af Amer: >60
GFR calc non Af Amer: >60
Glucose, Bld: 119 — ABNORMAL HIGH
Glucose, Bld: 84
Potassium: 3.4 — ABNORMAL LOW
Potassium: 4
Sodium: 136
Sodium: 136
Total Bilirubin: 0.4
Total Bilirubin: 0.5
Total Protein: 5.6 — ABNORMAL LOW
Total Protein: 6

## 2011-08-19 LAB — FETAL FIBRONECTIN
Fetal Fibronectin: NEGATIVE
Fetal Fibronectin: NEGATIVE

## 2011-08-19 LAB — URINALYSIS, ROUTINE W REFLEX MICROSCOPIC
Bilirubin Urine: NEGATIVE
Glucose, UA: 100 — AB
Glucose, UA: NEGATIVE
Hgb urine dipstick: NEGATIVE
Hgb urine dipstick: NEGATIVE
Ketones, ur: NEGATIVE
Ketones, ur: NEGATIVE
Leukocytes, UA: NEGATIVE
Nitrite: NEGATIVE
Nitrite: NEGATIVE
Protein, ur: 30 — AB
Protein, ur: NEGATIVE
Specific Gravity, Urine: 1.025
Specific Gravity, Urine: >1.03 — ABNORMAL HIGH
Urobilinogen, UA: 0.2
Urobilinogen, UA: 1
pH: 6
pH: 6

## 2011-08-19 LAB — URINE MICROSCOPIC-ADD ON

## 2011-08-19 LAB — URIC ACID
Uric Acid, Serum: 3.7
Uric Acid, Serum: 4.1

## 2012-01-26 ENCOUNTER — Inpatient Hospital Stay (HOSPITAL_COMMUNITY)
Admission: AD | Admit: 2012-01-26 | Discharge: 2012-01-26 | Disposition: A | Discharge status: Home / Self Care | Payer: 59 | Source: Ambulatory Visit | Attending: Obstetrics and Gynecology | Admitting: Obstetrics and Gynecology

## 2012-01-26 ENCOUNTER — Encounter (HOSPITAL_COMMUNITY): Payer: Self-pay | Admitting: *Deleted

## 2012-01-26 ENCOUNTER — Other Ambulatory Visit: Payer: Self-pay

## 2012-01-26 DIAGNOSIS — R079 Chest pain, unspecified: Secondary | ICD-10-CM | POA: Insufficient documentation

## 2012-01-26 DIAGNOSIS — M940 Chondrocostal junction syndrome [Tietze]: Secondary | ICD-10-CM | POA: Insufficient documentation

## 2012-01-26 DIAGNOSIS — O99891 Other specified diseases and conditions complicating pregnancy: Secondary | ICD-10-CM | POA: Insufficient documentation

## 2012-01-26 HISTORY — DX: Pure hypercholesterolemia, unspecified: E78.00

## 2012-01-26 HISTORY — DX: Other specified health status: Z78.9

## 2012-01-26 HISTORY — DX: Essential (primary) hypertension: I10

## 2012-01-26 NOTE — MAU Note (Signed)
Pt states that last ight she had trouble breathing-that it went away-at work today-she got light headed at times and her chest started hurting

## 2012-01-26 NOTE — MAU Provider Note (Signed)
History   Pt presents today c/o chest pain that comes and goes. She has had similar problems in the past and was diagnosed with chest wall pain. She denies LOC, current SOB, or any other problems. She denies abd pain, vag dc, bleeding, or any OB related problems. She is currently 7.2wks and has an [place] scheduled next week.  CSN: [id number]  Arrival date and time: 01/26/12 2057   First Provider Initiated Contact with Patient 01/26/12 2148      Chief Complaint  Patient presents with  . Chest Pain   HPI  OB History    Grav Para Term Preterm Abortions TAB SAB Ect Mult Living   3 2  2     1 2       Past Medical History  Diagnosis Date  . Hypertension   . No pertinent past medical history   . High cholesterol     History reviewed. No pertinent past surgical history.  Family History  Problem Relation Age of Onset  . Hypertension Mother   . Diabetes Mother   . Cancer Mother   . Hypertension Father   . Diabetes Father     History  Substance Use Topics  . Smoking status: Never Smoker   . Smokeless tobacco: Never Used  . Alcohol Use: No    Allergies: Allergies not on file  No prescriptions prior to admission    Review of Systems  Constitutional: Negative for fever and chills.  Eyes: Negative for blurred vision and double vision.  Respiratory: Positive for shortness of breath. Negative for cough, hemoptysis, sputum production and wheezing.   Cardiovascular: Positive for chest pain. Negative for palpitations, orthopnea, claudication, leg swelling and PND.  Gastrointestinal: Negative for heartburn, nausea, vomiting, abdominal pain, diarrhea and constipation.  Genitourinary: Negative for dysuria, urgency, frequency and hematuria.  Neurological: Negative for dizziness and headaches.  Psychiatric/Behavioral: Negative for depression and suicidal ideas.   Physical Exam   Blood pressure 137/84, pulse 83, temperature 98.2 F (36.8 C), temperature source Oral, resp. rate 20,  height 5' 5.75" (1.67 m), weight 216 lb (97.977 kg), SpO2 99.00%.  Physical Exam  Nursing note and vitals reviewed. Constitutional: She is oriented to person, place, and time. She appears well-developed and well-nourished. No distress.  HENT:  Head: Normocephalic and atraumatic.  Eyes: EOM are normal. Pupils are equal, round, and reactive to light.  Cardiovascular: Normal rate, regular rhythm and normal heart sounds.  Exam reveals no gallop and no friction rub.   No murmur heard. Respiratory: Effort normal and breath sounds normal. No respiratory distress. She has no wheezes. She has no rales. Chest wall is not dull to percussion. She exhibits tenderness (pt pain is exactly reproducible to palpation along the sternal borders.). She exhibits no mass, no laceration, no crepitus, no edema, no deformity, no swelling and no retraction.  GI: Soft. She exhibits no distension and no mass. There is no tenderness. There is no rebound and no guarding.  Neurological: She is alert and oriented to person, place, and time.  Skin: Skin is warm and dry. She is not diaphoretic.  Psychiatric: She has a normal mood and affect. Her behavior is normal. Judgment and thought content normal.    MAU Course  Procedures  ECG shows normal sinus rhythm. Normal ECG.  Assessment and Plan  Costochondritis: discussed with pt at length. She will f/u with her OB provider. Discussed diet, activity, risks, and precautions.   Clinton Gallant. Jerney Baksh III, DrHSc, MPAS, PA-C  01/26/2012, 10:06  PM  

## 2012-01-26 NOTE — Discharge Instructions (Signed)
Costochondritis  Costochondritis (Tietze syndrome), or costochondral separation, is a swelling and irritation (inflammation) of the tissue (cartilage) that connects your ribs with your breastbone (sternum). It may occur on its own (spontaneously), through damage caused by an accident (trauma), or simply from coughing or minor exercise. It may take up to 6 weeks to get better and longer if you are unable to be conservative in your activities.  HOME CARE INSTRUCTIONS    Avoid exhausting physical activity. Try not to strain your ribs during normal activity. This would include any activities using chest, belly (abdominal), and side muscles, especially if heavy weights are used.   Use ice for 15 to 20 minutes per hour while awake for the first 2 days. Place the ice in a plastic bag, and place a towel between the bag of ice and your skin.   Only take over-the-counter or prescription medicines for pain, discomfort, or fever as directed by your caregiver.  SEEK IMMEDIATE MEDICAL CARE IF:    Your pain increases or you are very uncomfortable.   You have a fever.   You develop difficulty with your breathing.   You cough up blood.   You develop worse chest pains, shortness of breath, sweating, or vomiting.   You develop new, unexplained problems (symptoms).  MAKE SURE YOU:    Understand these instructions.   Will watch your condition.   Will get help right away if you are not doing well or get worse.  Document Released: 07/28/2005 Document Revised: 10/07/2011 Document Reviewed: 06/05/2008  [employer] Patient Information 2012 [employer]

## 2012-02-17 LAB — OB RESULTS CONSOLE ANTIBODY SCREEN: Antibody Screen: NEGATIVE

## 2012-02-17 LAB — OB RESULTS CONSOLE RPR: RPR: NONREACTIVE

## 2012-02-17 LAB — OB RESULTS CONSOLE RUBELLA ANTIBODY, IGM: Rubella: IMMUNE

## 2012-02-17 LAB — OB RESULTS CONSOLE GC/CHLAMYDIA
Chlamydia: POSITIVE
Gonorrhea: NEGATIVE

## 2012-02-17 LAB — OB RESULTS CONSOLE HIV ANTIBODY (ROUTINE TESTING): HIV: NONREACTIVE

## 2012-02-17 LAB — OB RESULTS CONSOLE ABO/RH: RH Type: POSITIVE

## 2012-02-17 LAB — OB RESULTS CONSOLE HEPATITIS B SURFACE ANTIGEN: Hepatitis B Surface Ag: NEGATIVE

## 2012-02-27 ENCOUNTER — Inpatient Hospital Stay (HOSPITAL_COMMUNITY)
Admission: AD | Admit: 2012-02-27 | Discharge: 2012-02-27 | Disposition: A | Discharge status: Hospice | Payer: 59 | Source: Ambulatory Visit | Attending: Obstetrics and Gynecology | Admitting: Obstetrics and Gynecology

## 2012-02-27 ENCOUNTER — Encounter (HOSPITAL_COMMUNITY): Payer: Self-pay | Admitting: *Deleted

## 2012-02-27 DIAGNOSIS — O99891 Other specified diseases and conditions complicating pregnancy: Secondary | ICD-10-CM | POA: Insufficient documentation

## 2012-02-27 DIAGNOSIS — R0789 Other chest pain: Secondary | ICD-10-CM

## 2012-02-27 DIAGNOSIS — R12 Heartburn: Secondary | ICD-10-CM

## 2012-02-27 DIAGNOSIS — R079 Chest pain, unspecified: Secondary | ICD-10-CM | POA: Insufficient documentation

## 2012-02-27 DIAGNOSIS — O26899 Other specified pregnancy related conditions, unspecified trimester: Secondary | ICD-10-CM

## 2012-02-27 NOTE — MAU Provider Note (Signed)
History     CSN: [id number]  Arrival date and time: 02/27/12 1653   First Provider Initiated Contact with Patient 02/27/12 1727      No chief complaint on file.  HPI [name] 26 y.o. [redacted]w[redacted]d  Comes to MAU for evaluation of periodic pain behind her sternum.  Happened today while she was at work.  Lasted for about 5 minutes and is now gone.  Is currently in no distress.  Was seen in MAU on 01-26-12 for the same complaint - diagnosed with costochondritis. EKG at that time was normal.  Has not had any further evaluation of complaint in the office.  OB History    Grav Para Term Preterm Abortions TAB SAB Ect Mult Living   2 1  1      2       Past Medical History  Diagnosis Date  . Hypertension   . No pertinent past medical history   . High cholesterol     History reviewed. No pertinent past surgical history.  Family History  Problem Relation Age of Onset  . Hypertension Mother   . Diabetes Mother   . Cancer Mother   . Hypertension Father   . Diabetes Father     History  Substance Use Topics  . Smoking status: Never Smoker   . Smokeless tobacco: Never Used  . Alcohol Use: No    Allergies: No Known Allergies  Prescriptions prior to admission  Medication Sig Dispense Refill  . hydrALAZINE (APRESOLINE) 10 MG tablet Take 10 mg by mouth 3 (three) times daily.        Review of Systems  Constitutional: Negative for fever.  Respiratory: Negative for cough and shortness of breath.   Cardiovascular: Positive for chest pain.       Currently resolved upon arrival to MAU  Gastrointestinal: Negative for heartburn, nausea, vomiting, abdominal pain, diarrhea and constipation.  Genitourinary: Negative for dysuria.   Physical Exam   Blood pressure 125/86, pulse 85, temperature 98.6 F (37 C), temperature source Oral, resp. rate 16, height 5\' 6"  (1.676 m), weight 214 lb (97.07 kg), last menstrual period 02/01/2012, SpO2 100.00%.  Physical Exam  Nursing note and vitals  reviewed. Constitutional: She is oriented to person, place, and time. She appears well-developed and well-nourished.       o2 sat 100%  HENT:  Head: Normocephalic.  Eyes: EOM are normal.  Neck: Neck supple.  Cardiovascular: Normal rate, regular rhythm and normal heart sounds.   Respiratory: Effort normal and breath sounds normal. No respiratory distress.  GI: There is no tenderness.       FHT 167 heard with doppler  Musculoskeletal: Normal range of motion.       Pain is behind sternum between her breasts.  No pain at present.  No pain with palpation.  Neurological: She is alert and oriented to person, place, and time.  Skin: Skin is warm and dry.  Psychiatric: She has a normal mood and affect.    MAU Course  Procedures Blood pressures have been normal and complaint that brought her to the hospital is currently resolved.  No distress at present.    MDM [id number] Consult with Dr. Ambrose Mantle - client likely needs to see a cardiologist due to persistent periodic chest pain to R/O a cardiac problem 1845  Client resting in bed and dozing on her side.  Discussed at length with client the possible causes of chest discomfort - cardiac, respiratory, musculoskeletal, gastrointestinal.  While cardiac cannot be  ruled out, the fact that the pain resolves after 5 minutes makes it seem less concerning.  Unable to rule out cardiac cause here today.  Will not order another EKG as pain has resolved and previous EKG 12-2011 was normal.  Assessment and Plan  Pregnancy 10 weeks Chest discomfort, currently resolved  Plan Make an appointment to be seen in the office as you may need to be referred to a cardiologist Avoid any fried foods, carbonated beverages, orange juice which may trigger heartburn which also may occur behind the sternum in the same place you have periodic pain. Return for further evaluation if your symptoms worsen.  If you feel the symptoms are cardiac, will need to be seen and [facility]  ER.  [name] 02/27/2012, 6:14 PM

## 2012-06-23 LAB — OB RESULTS CONSOLE GC/CHLAMYDIA: Chlamydia: NEGATIVE

## 2012-07-04 ENCOUNTER — Encounter (HOSPITAL_COMMUNITY): Payer: Self-pay | Admitting: *Deleted

## 2012-07-04 ENCOUNTER — Inpatient Hospital Stay (HOSPITAL_COMMUNITY)
Admission: AD | Admit: 2012-07-04 | Discharge: 2012-07-05 | Disposition: A | Discharge status: Home / Self Care | Payer: 59 | Source: Ambulatory Visit | Attending: Obstetrics and Gynecology | Admitting: Obstetrics and Gynecology

## 2012-07-04 DIAGNOSIS — R109 Unspecified abdominal pain: Secondary | ICD-10-CM | POA: Insufficient documentation

## 2012-07-04 DIAGNOSIS — O4700 False labor before 37 completed weeks of gestation, unspecified trimester: Secondary | ICD-10-CM

## 2012-07-04 LAB — URINALYSIS, ROUTINE W REFLEX MICROSCOPIC
Bilirubin Urine: NEGATIVE
Glucose, UA: NEGATIVE mg/dL
Hgb urine dipstick: NEGATIVE
Ketones, ur: 15 mg/dL — AB
Leukocytes, UA: NEGATIVE
Nitrite: NEGATIVE
Protein, ur: NEGATIVE mg/dL
Specific Gravity, Urine: >1.03 — ABNORMAL HIGH (ref 1.005–1.030)
Urobilinogen, UA: 0.2 mg/dL (ref 0.0–1.0)
pH: 5.5 (ref 5.0–8.0)

## 2012-07-04 LAB — FETAL FIBRONECTIN: Fetal Fibronectin: NEGATIVE

## 2012-07-04 MED ORDER — LACTATED RINGERS IV BOLUS (SEPSIS)
1000.0000 mL | Freq: Once | INTRAVENOUS | Status: AC
  Administered 2012-07-04: 1000 mL via INTRAVENOUS

## 2012-07-04 MED ORDER — NIFEDIPINE 10 MG PO CAPS
20.0000 mg | ORAL_CAPSULE | Freq: Once | ORAL | Status: AC
  Administered 2012-07-05: 20 mg via ORAL
  Filled 2012-07-04: qty 2

## 2012-07-04 NOTE — MAU Provider Note (Signed)
History     CSN: [id number]  Arrival date and time: 07/04/12 4098   First Provider Initiated Contact with Patient 07/04/12 2109      Chief Complaint  Patient presents with  . Contractions   HPI  Pt is 29 weeks and complains of abdominal pain on and off for several days.  The pain is worse at work.  She is International aid/development worker at [employer].  The pain is better when she is more relaxed.  Pt has a hisotry of twin delivery at 33 weeks due to pre-eclampsia.  She has had slightly elevated BP this pregnancy with proteinuria.  Pt denies constipation, diarrhea, leakage of fluid, spotting, bleeding, nausea or vomiting. She last had intercourse 3 months ago.    Past Medical History  Diagnosis Date  . Hypertension   . No pertinent past medical history   . High cholesterol     History reviewed. No pertinent past surgical history.  Family History  Problem Relation Age of Onset  . Hypertension Mother   . Diabetes Mother   . Cancer Mother   . Hypertension Father   . Diabetes Father     History  Substance Use Topics  . Smoking status: Never Smoker   . Smokeless tobacco: Never Used  . Alcohol Use: No    Allergies: No Known Allergies  No prescriptions prior to admission    Review of Systems  Constitutional: Negative for fever and chills.  Gastrointestinal: Positive for abdominal pain. Negative for nausea, vomiting, diarrhea and constipation.  Genitourinary: Negative for dysuria.   Physical Exam   Blood pressure 128/85, pulse 101, temperature 98 F (36.7 C), temperature source Oral, resp. rate 18, height 5\' 5"  (1.651 m), weight 99.428 kg (219 lb 3.2 oz), last menstrual period 02/01/2012.  Physical Exam  Vitals reviewed. Constitutional: She is oriented to person, place, and time. She appears well-developed and well-nourished.  HENT:  Head: Normocephalic and atraumatic.  Eyes: Pupils are equal, round, and reactive to light.  Neck: Normal range of motion. Neck supple.    Cardiovascular: Normal rate.   Respiratory: Effort normal.  GI: Soft. She exhibits no distension. There is no tenderness. There is no rebound and no guarding.       FHR reactive  Genitourinary:       Cervix closed; fetal fibronectin obtained  Musculoskeletal: Normal range of motion.  Neurological: She is alert and oriented to person, place, and time.  Skin: Skin is warm and dry.  Psychiatric: She has a normal mood and affect.   Results for orders placed during the hospital encounter of 07/04/12 (from the past 24 hour(s))  URINALYSIS, ROUTINE W REFLEX MICROSCOPIC     Status: Abnormal   Collection Time   07/04/12  7:26 PM      Component Value Range   Color, Urine YELLOW  YELLOW   APPearance CLEAR  CLEAR   Specific Gravity, Urine >1.030 (*) 1.005 - 1.030   pH 5.5  5.0 - 8.0   Glucose, UA NEGATIVE  NEGATIVE mg/dL   Hgb urine dipstick NEGATIVE  NEGATIVE   Bilirubin Urine NEGATIVE  NEGATIVE   Ketones, ur 15 (*) NEGATIVE mg/dL   Protein, ur NEGATIVE  NEGATIVE mg/dL   Urobilinogen, UA 0.2  0.0 - 1.0 mg/dL   Nitrite NEGATIVE  NEGATIVE   Leukocytes, UA NEGATIVE  NEGATIVE  FETAL FIBRONECTIN     Status: Normal   Collection Time   07/04/12  9:55 PM      Component Value Range  Fetal Fibronectin NEGATIVE  NEGATIVE    MAU Course  Procedures Pt had occ ctx when first arrived; IVF given since pt's urine slightly dehydrated; pt's abdominal improved after IV  fluids, then pt had recurrence of ctx every 4 to 5 minutes. Fetal fibronectin negative Procardia 20mg  given in MAU with prescription for Procardia 30mg  extended release for pt to take BID for contractions Discussed with Dr. Senaida Ores Pt to keep OB appointment and to f/u sooner if increase in pain, ctx or any bleeding or loss of fluid.    Assessment and Plan  Threatened preterm labor Procardia 30mg  BID for contractions F/u in office as scheduled  Therese Rocco 07/04/2012, 9:11 PM
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
## 2012-07-04 NOTE — Progress Notes (Signed)
Pt states she has felt lightheaded

## 2012-07-04 NOTE — Progress Notes (Signed)
Pt states she is feeling achy
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
## 2012-07-05 MED ORDER — NIFEDIPINE ER OSMOTIC RELEASE 30 MG PO TB24: ORAL_TABLET | ORAL | Status: DC

## 2012-08-16 LAB — OB RESULTS CONSOLE GBS: GBS: POSITIVE

## 2012-08-31 ENCOUNTER — Encounter (HOSPITAL_COMMUNITY): Payer: Self-pay | Admitting: *Deleted

## 2012-08-31 ENCOUNTER — Inpatient Hospital Stay (HOSPITAL_COMMUNITY)
Admission: AD | Admit: 2012-08-31 | Discharge: 2012-08-31 | Disposition: A | Discharge status: Home / Self Care | Payer: 59 | Source: Ambulatory Visit | Attending: Obstetrics and Gynecology | Admitting: Obstetrics and Gynecology

## 2012-08-31 DIAGNOSIS — O10019 Pre-existing essential hypertension complicating pregnancy, unspecified trimester: Secondary | ICD-10-CM | POA: Insufficient documentation

## 2012-08-31 DIAGNOSIS — O169 Unspecified maternal hypertension, unspecified trimester: Secondary | ICD-10-CM

## 2012-08-31 DIAGNOSIS — O139 Gestational [pregnancy-induced] hypertension without significant proteinuria, unspecified trimester: Secondary | ICD-10-CM

## 2012-08-31 LAB — COMPREHENSIVE METABOLIC PANEL
ALT: 9 U/L (ref 0–35)
AST: 15 U/L (ref 0–37)
Albumin: 2.4 g/dL — ABNORMAL LOW (ref 3.5–5.2)
Alkaline Phosphatase: 148 U/L — ABNORMAL HIGH (ref 39–117)
BUN: 9 mg/dL (ref 6–23)
CO2: 19 mEq/L (ref 19–32)
Calcium: 9.2 mg/dL (ref 8.4–10.5)
Chloride: 103 mEq/L (ref 96–112)
Creatinine, Ser: 0.58 mg/dL (ref 0.50–1.10)
GFR calc Af Amer: >90 mL/min (ref >90)
GFR calc non Af Amer: >90 mL/min (ref >90)
Glucose, Bld: 82 mg/dL (ref 70–99)
Potassium: 3.4 mEq/L — ABNORMAL LOW (ref 3.5–5.1)
Sodium: 134 mEq/L — ABNORMAL LOW (ref 135–145)
Total Bilirubin: 0.2 mg/dL — ABNORMAL LOW (ref 0.3–1.2)
Total Protein: 6.3 g/dL (ref 6.0–8.3)

## 2012-08-31 LAB — URINALYSIS, ROUTINE W REFLEX MICROSCOPIC
Bilirubin Urine: NEGATIVE
Glucose, UA: NEGATIVE mg/dL
Hgb urine dipstick: NEGATIVE
Ketones, ur: NEGATIVE mg/dL
Leukocytes, UA: NEGATIVE
Nitrite: NEGATIVE
Protein, ur: NEGATIVE mg/dL
Specific Gravity, Urine: >1.03 — ABNORMAL HIGH (ref 1.005–1.030)
Urobilinogen, UA: 1 mg/dL (ref 0.0–1.0)
pH: 6 (ref 5.0–8.0)

## 2012-08-31 LAB — CBC
HCT: 31.8 % — ABNORMAL LOW (ref 36.0–46.0)
Hemoglobin: 10.9 g/dL — ABNORMAL LOW (ref 12.0–15.0)
MCH: 29.2 pg (ref 26.0–34.0)
MCHC: 34.3 g/dL (ref 30.0–36.0)
MCV: 85.3 fL (ref 78.0–100.0)
Platelets: 185 10*3/uL (ref 150–400)
RBC: 3.73 MIL/uL — ABNORMAL LOW (ref 3.87–5.11)
RDW: 12.6 % (ref 11.5–15.5)
WBC: 7.5 10*3/uL (ref 4.0–10.5)

## 2012-08-31 MED ORDER — LABETALOL HCL 100 MG PO TABS: 100.0000 mg | ORAL_TABLET | Freq: Two times a day (BID) | ORAL | Status: DC

## 2012-08-31 MED ORDER — LACTATED RINGERS IV SOLN
INTRAVENOUS | Status: DC
  Administered 2012-08-31: 21:00:00 via INTRAVENOUS

## 2012-08-31 MED ORDER — LABETALOL HCL 5 MG/ML IV SOLN
10.0000 mg | INTRAVENOUS | Status: DC | PRN
  Administered 2012-08-31: 10 mg via INTRAVENOUS
  Filled 2012-08-31: qty 4

## 2012-08-31 MED ORDER — LABETALOL HCL 100 MG PO TABS
100.0000 mg | ORAL_TABLET | Freq: Once | ORAL | Status: AC
  Administered 2012-08-31: 100 mg via ORAL
  Filled 2012-08-31: qty 1

## 2012-08-31 MED ORDER — ACETAMINOPHEN 500 MG PO TABS
1000.0000 mg | ORAL_TABLET | Freq: Once | ORAL | Status: AC
  Administered 2012-08-31: 1000 mg via ORAL
  Filled 2012-08-31: qty 2

## 2012-08-31 NOTE — MAU Note (Signed)
Pt states she has elevated B/P and has had a headache all day

## 2012-08-31 NOTE — MAU Provider Note (Signed)
History     CSN: [id number]  Arrival date and time: 08/31/12 1947   None     Chief Complaint  Patient presents with  . Headache   HPI 26 y.o. [id number] at [redacted]w[redacted]d with headache starting today. Blurry vision, spots when stands, dizziness. No RUQ pain. Chronic HTN, no meds during pregnancy but BP rising over last few visits. Seen in office today BP 143/91, trace protein. 24 hour urine protein at ~27 weeks normal. Was collecting 24 hour urine today but left at home. Not dilated in office today. Contracting last few days q 15 minutes when active but not at rest. Now contracting q 3 minutes, stronger. No LOF or VB. Baby moving well. Prior pregnancy delivered twins at 33 wks for severe preeclampsia.  OB History    Grav Para Term Preterm Abortions TAB SAB Ect Mult Living   2 1  1      2       Past Medical History  Diagnosis Date  . Hypertension   . No pertinent past medical history   . High cholesterol     History reviewed. No pertinent past surgical history.  Family History  Problem Relation Age of Onset  . Hypertension Mother   . Diabetes Mother   . Cancer Mother   . Hypertension Father   . Diabetes Father     History  Substance Use Topics  . Smoking status: Never Smoker   . Smokeless tobacco: Never Used  . Alcohol Use: No    Allergies: No Known Allergies  Prescriptions prior to admission  Medication Sig Dispense Refill  . NIFEdipine (PROCARDIA XL) 30 MG 24 hr tablet Use two times daily as needed for contractions  20 tablet  0  . Prenatal Vit-Fe Fumarate-FA (PRENATAL MULTIVITAMIN) TABS Take 1 tablet by mouth daily.        Review of Systems  Constitutional: Negative for fever and chills.  Eyes: Positive for blurred vision.  Respiratory: Positive for shortness of breath. Negative for cough and wheezing.   Cardiovascular: Negative for chest pain and palpitations.  Gastrointestinal: Negative for nausea, vomiting, abdominal pain, diarrhea and constipation.    Genitourinary: Negative for dysuria.  Neurological: Positive for dizziness and headaches. Negative for weakness.   Physical Exam   Blood pressure 158/104, pulse 87, temperature 98 F (36.7 C), resp. rate 20, height 5\' 7"  (1.702 m), weight 103.137 kg (227 lb 6 oz), last menstrual period 02/01/2012, SpO2 99.00%.  Filed Vitals:   08/31/12 2008 08/31/12 2012 08/31/12 2034 08/31/12 2040  BP: 161/111 158/104 162/108 138/119  Pulse: 87  91 92  Temp: 98 F (36.7 C)     Resp: 20     Height: 5\' 7"  (1.702 m)     Weight: 103.137 kg (227 lb 6 oz)     SpO2: 99%       Physical Exam  Constitutional: She is oriented to person, place, and time. She appears well-developed and well-nourished. No distress.  HENT:  Head: Normocephalic and atraumatic.  Eyes: Conjunctivae normal and EOM are normal.  Neck: Normal range of motion. Neck supple.  Cardiovascular: Normal rate, regular rhythm and normal heart sounds.   Respiratory: Effort normal and breath sounds normal. No respiratory distress. She has no wheezes.  GI: Soft. There is no tenderness. There is no rebound and no guarding.  Musculoskeletal: She exhibits edema (mild bilateral lower extremity, non-pitting). She exhibits no tenderness.  Neurological: She is alert and oriented to person, place, and time.  Skin: Skin  is warm and dry.  Psychiatric: She has a normal mood and affect.   Results for orders placed during the hospital encounter of 08/31/12 (from the past 24 hour(s))  URINALYSIS, ROUTINE W REFLEX MICROSCOPIC     Status: Abnormal   Collection Time   08/31/12  7:55 PM      Component Value Range   Color, Urine YELLOW  YELLOW   APPearance CLEAR  CLEAR   Specific Gravity, Urine >1.030 (*) 1.005 - 1.030   pH 6.0  5.0 - 8.0   Glucose, UA NEGATIVE  NEGATIVE mg/dL   Hgb urine dipstick NEGATIVE  NEGATIVE   Bilirubin Urine NEGATIVE  NEGATIVE   Ketones, ur NEGATIVE  NEGATIVE mg/dL   Protein, ur NEGATIVE  NEGATIVE mg/dL   Urobilinogen, UA 1.0   0.0 - 1.0 mg/dL   Nitrite NEGATIVE  NEGATIVE   Leukocytes, UA NEGATIVE  NEGATIVE  CBC     Status: Abnormal   Collection Time   08/31/12  8:25 PM      Component Value Range   WBC 7.5  4.0 - 10.5 K/uL   RBC 3.73 (*) 3.87 - 5.11 MIL/uL   Hemoglobin 10.9 (*) 12.0 - 15.0 g/dL   HCT 16.1 (*) 09.6 - 04.5 %   MCV 85.3  78.0 - 100.0 fL   MCH 29.2  26.0 - 34.0 pg   MCHC 34.3  30.0 - 36.0 g/dL   RDW 40.9  81.1 - 91.4 %   Platelets 185  150 - 400 K/uL  COMPREHENSIVE METABOLIC PANEL     Status: Abnormal   Collection Time   08/31/12  8:25 PM      Component Value Range   Sodium 134 (*) 135 - 145 mEq/L   Potassium 3.4 (*) 3.5 - 5.1 mEq/L   Chloride 103  96 - 112 mEq/L   CO2 19  19 - 32 mEq/L   Glucose, Bld 82  70 - 99 mg/dL   BUN 9  6 - 23 mg/dL   Creatinine, Ser 7.82  0.50 - 1.10 mg/dL   Calcium 9.2  8.4 - 95.6 mg/dL   Total Protein 6.3  6.0 - 8.3 g/dL   Albumin 2.4 (*) 3.5 - 5.2 g/dL   AST 15  0 - 37 U/L   ALT 9  0 - 35 U/L   Alkaline Phosphatase 148 (*) 39 - 117 U/L   Total Bilirubin 0.2 (*) 0.3 - 1.2 mg/dL   GFR calc non Af Amer >90  >90 mL/min   GFR calc Af Amer >90  >90 mL/min     Tylenol 1000 mg given. Headache resolved. Labetalol 10 mg given IV. 21:05, BP --> 140/78 but back up to 160/95, 153/100, 156/107.     MAU Course  Procedures   FHTs: 135, moderate variability, + accels, no decels.  CTX q 3-4 min Cervix:  FT/thick/high.   Assessment and Plan  26 y.o. G2P0102 at [redacted]w[redacted]d with  - Severe range BP, headache, vision changes, no proteinuria. Urine protein/creatinine ratio pending - Chronic HTN, worsening. - Sent home with 24-hour urine collection  - 100 mg labetalol PO BID (first dose tonight) - F/u in office tomorrow.  Dr. Ambrose Mantle saw and examined patient and checked cervix. Discussed plan with patient.  Napoleon Form 08/31/2012, 8:21 PM

## 2012-09-01 LAB — PROTEIN / CREATININE RATIO, URINE
Creatinine, Urine: 164.04 mg/dL
Protein Creatinine Ratio: 0.13 (ref 0.00–0.15)
Total Protein, Urine: 21.5 mg/dL

## 2012-09-05 ENCOUNTER — Inpatient Hospital Stay (HOSPITAL_COMMUNITY)
Admission: AD | Admit: 2012-09-05 | Discharge: 2012-09-08 | DRG: 774 | Disposition: A | Discharge status: Home / Self Care | Payer: 59 | Source: Ambulatory Visit | Attending: Obstetrics and Gynecology | Admitting: Obstetrics and Gynecology

## 2012-09-05 ENCOUNTER — Encounter (HOSPITAL_COMMUNITY): Payer: Self-pay | Admitting: Obstetrics and Gynecology

## 2012-09-05 DIAGNOSIS — O1002 Pre-existing essential hypertension complicating childbirth: Principal | ICD-10-CM | POA: Diagnosis present

## 2012-09-05 LAB — URINALYSIS, ROUTINE W REFLEX MICROSCOPIC
Bilirubin Urine: NEGATIVE
Glucose, UA: NEGATIVE mg/dL
Hgb urine dipstick: NEGATIVE
Ketones, ur: NEGATIVE mg/dL
Leukocytes, UA: NEGATIVE
Nitrite: NEGATIVE
Protein, ur: NEGATIVE mg/dL
Specific Gravity, Urine: >1.03 — ABNORMAL HIGH (ref 1.005–1.030)
Urobilinogen, UA: 1 mg/dL (ref 0.0–1.0)
pH: 6 (ref 5.0–8.0)

## 2012-09-05 LAB — COMPREHENSIVE METABOLIC PANEL
ALT: 10 U/L (ref 0–35)
AST: 20 U/L (ref 0–37)
Albumin: 2.5 g/dL — ABNORMAL LOW (ref 3.5–5.2)
Alkaline Phosphatase: 163 U/L — ABNORMAL HIGH (ref 39–117)
BUN: 8 mg/dL (ref 6–23)
CO2: 19 mEq/L (ref 19–32)
Calcium: 8.9 mg/dL (ref 8.4–10.5)
Chloride: 106 mEq/L (ref 96–112)
Creatinine, Ser: 0.53 mg/dL (ref 0.50–1.10)
GFR calc Af Amer: >90 mL/min (ref >90)
GFR calc non Af Amer: >90 mL/min (ref >90)
Glucose, Bld: 67 mg/dL — ABNORMAL LOW (ref 70–99)
Potassium: 4.2 mEq/L (ref 3.5–5.1)
Sodium: 135 mEq/L (ref 135–145)
Total Bilirubin: 0.3 mg/dL (ref 0.3–1.2)
Total Protein: 6 g/dL (ref 6.0–8.3)

## 2012-09-05 LAB — CBC
HCT: 33.6 % — ABNORMAL LOW (ref 36.0–46.0)
Hemoglobin: 11.4 g/dL — ABNORMAL LOW (ref 12.0–15.0)
MCH: 29.2 pg (ref 26.0–34.0)
MCHC: 33.9 g/dL (ref 30.0–36.0)
MCV: 85.9 fL (ref 78.0–100.0)
Platelets: 193 10*3/uL (ref 150–400)
RBC: 3.91 MIL/uL (ref 3.87–5.11)
RDW: 12.7 % (ref 11.5–15.5)
WBC: 8.4 10*3/uL (ref 4.0–10.5)

## 2012-09-05 MED ORDER — LACTATED RINGERS IV SOLN: 500.0000 mL | INTRAVENOUS | Status: DC | PRN

## 2012-09-05 MED ORDER — LABETALOL HCL 5 MG/ML IV SOLN
10.0000 mg | Freq: Once | INTRAVENOUS | Status: AC
  Administered 2012-09-05: 10 mg via INTRAVENOUS
  Filled 2012-09-05: qty 4

## 2012-09-05 MED ORDER — LACTATED RINGERS IV SOLN
INTRAVENOUS | Status: DC
  Administered 2012-09-05: 21:00:00 via INTRAVENOUS

## 2012-09-05 MED ORDER — LACTATED RINGERS IV SOLN
INTRAVENOUS | Status: DC
  Administered 2012-09-06: 06:00:00 via INTRAVENOUS

## 2012-09-05 MED ORDER — LIDOCAINE HCL (PF) 1 % IJ SOLN
30.0000 mL | INTRAMUSCULAR | Status: DC | PRN
  Filled 2012-09-05: qty 30

## 2012-09-05 MED ORDER — OXYCODONE-ACETAMINOPHEN 5-325 MG PO TABS: 1.0000 | ORAL_TABLET | ORAL | Status: DC | PRN

## 2012-09-05 MED ORDER — PENICILLIN G POTASSIUM 5000000 UNITS IJ SOLR
5.0000 10*6.[IU] | Freq: Once | INTRAVENOUS | Status: AC
  Administered 2012-09-05: 5 10*6.[IU] via INTRAVENOUS
  Filled 2012-09-05: qty 5

## 2012-09-05 MED ORDER — IBUPROFEN 600 MG PO TABS: 600.0000 mg | ORAL_TABLET | Freq: Four times a day (QID) | ORAL | Status: DC | PRN

## 2012-09-05 MED ORDER — ACETAMINOPHEN 325 MG PO TABS
650.0000 mg | ORAL_TABLET | Freq: Once | ORAL | Status: AC
  Administered 2012-09-05: 650 mg via ORAL
  Filled 2012-09-05: qty 2

## 2012-09-05 MED ORDER — OXYTOCIN 40 UNITS IN LACTATED RINGERS INFUSION - SIMPLE MED
62.5000 mL/h | INTRAVENOUS | Status: DC
  Administered 2012-09-06: 999 mL/h via INTRAVENOUS
  Filled 2012-09-05: qty 1000

## 2012-09-05 MED ORDER — PENICILLIN G POTASSIUM 5000000 UNITS IJ SOLR
2.5000 10*6.[IU] | INTRAVENOUS | Status: DC
  Administered 2012-09-06 (×2): 2.5 10*6.[IU] via INTRAVENOUS
  Filled 2012-09-05 (×4): qty 2.5

## 2012-09-05 MED ORDER — OXYTOCIN BOLUS FROM INFUSION: 500.0000 mL | INTRAVENOUS | Status: DC

## 2012-09-05 MED ORDER — LABETALOL HCL 5 MG/ML IV SOLN
10.0000 mg | Freq: Once | INTRAVENOUS | Status: AC
  Administered 2012-09-05: 10 mg via INTRAVENOUS

## 2012-09-05 MED ORDER — LABETALOL HCL 5 MG/ML IV SOLN
INTRAVENOUS | Status: AC
  Filled 2012-09-05: qty 4

## 2012-09-05 MED ORDER — OXYTOCIN 40 UNITS IN LACTATED RINGERS INFUSION - SIMPLE MED
1.0000 m[IU]/min | INTRAVENOUS | Status: DC
  Administered 2012-09-05: 2 m[IU]/min via INTRAVENOUS

## 2012-09-05 NOTE — MAU Note (Signed)
Pt reports she checked her b/p at [facility] and it was 174/104, has had elevated pressures in the office and when she called them they said to come here. Labs done 3 days ago and they were normal. Blurred vision.

## 2012-09-05 NOTE — H&P (Signed)
[name], [name]            ACCOUNT NO.:  [id number]  MEDICAL RECORD NO.:  [id number]  LOCATION:  NF62                          FACILITY:  [facility]  PHYSICIAN:  Malachi Pro. Ambrose Mantle, M.D. DATE OF BIRTH:  10-03-1986  DATE OF ADMISSION:  09/05/2012 DATE OF DISCHARGE:  09/05/2012                             HISTORY & PHYSICAL   PRESENT ILLNESS:  This is a 26 year old black female, para 0-1-0-2. Twins were delivered prematurely with last menstrual period on December 06, 2011, [facility] on September 19, 2012, based on an ultrasound at 8 weeks and 3 days, done on February 11, 2012.  The patient was admitted because of significant high blood pressure.  Blood group and type is A positive with negative antibody.  Pap smear normal.  Rubella immune.  RPR nonreactive.  Urine culture negative.  Hepatitis B surface antigen negative.  HIV negative.  Hepatitis B surface antigen negative. Hemoglobin electrophoresis AA, GC negative.  Chlamydia positive x2, treated with Zithromax.  First trimester screen negative.  Cystic fibrosis screen negative.  AFP screen was negative.  One-hour Glucola was elevated 3-hour GTT.  One abnormal value, but not enough for gestational diabetes mellitus.  The patient began her prenatal course at [redacted] weeks gestation.  Had a positive Chlamydia, treated.  Repeat was positive, treated again.  The patient had a history of high blood pressure, but all of her blood pressures were normal until August 16, 2012, when she was noted to have a blood pressure of 142/100.  Repeat pressure that day was 140/86.  At her next prenatal visit on August 25, 2012, her blood pressure of 138/78.  Since then, her blood pressures have risen, she has been placed on labetalol.  She is now taking 200 mg twice a day.  She has had couple of PIH panels done, which were negative for preeclampsia.  Her 24-hour urine showed less than 200 mg in 24 hours.  She came to the hospital tonight because [facility] blood pressure  [facility] 174[facility]104.  Blood pressure here was essentially the same.  She also complains of a headache, so she was admitted for induction of labor.  Past medical history reveals she has had a history of allergic rhinitis, anxiety attacks, Chlamydia, gonorrhea, hypertension, preeclampsia with her first pregnancy at 32 weeks delivered at 33 weeks.  Readmitted postpartum for persistent PIH and given magnesium sulfate.  There was no surgical history on file.  ALLERGIES:  She has no known drug allergies, no latex allergy, and no food allergies.  OBSTETRIC HISTORY:  In June of 2008, she delivered at 33 weeks 3 pounds and 14 ounces female, and 4 pounds and 14 ounces female.  She was readmitted postpartum with high blood pressure and was given magnesium sulfate.  The patient denies tobacco, alcohol, and illicit substance abuse.  She has a high school education.  PHYSICAL EXAMINATION:  VITAL SIGNS:  Blood pressure on admission 167/107, temperature 98, pulse 87, respirations 18. HEART:  Normal size and sounds.  No murmurs. LUNGS:  Clear to auscultation.  Fundal height on August 25, 2012, was 37 cm.  Fetal heart tones were normal.  Cervix has not been examined at this time.  On August 16 2012, the cervix was closed presenting part very high.  ADMITTING IMPRESSION:  Intrauterine pregnancy at 38 weeks and 0 days. Pregnancy-induced high blood pressure or chronic hypertension.  The patient was admitted for induction of labor and control of her blood pressure.     Malachi Pro. Ambrose Mantle, M.D.     TFH/MEDQ  D:  09/05/2012  T:  09/05/2012  Job:  [id number]

## 2012-09-05 NOTE — MAU Provider Note (Signed)
History     CSN: [id number]  Arrival date and time: 09/05/12 2956   First Provider Initiated Contact with Patient 09/05/12 2024      Chief Complaint  Patient presents with  . Hypertension   HPI [name] 26 y.o. [redacted]w[redacted]d   Comes to MAU tonight with a headache, increased BP and blurred vision.  Has history of elevated BP and did 24 hour urine last week.  Is on Labetalol for BP.  Took medication today.  Called the office and was advised to come to the hospital for evaluation.  OB History    Grav Para Term Preterm Abortions TAB SAB Ect Mult Living   2 1  1      2       Past Medical History  Diagnosis Date  . Hypertension   . No pertinent past medical history   . High cholesterol     History reviewed. No pertinent past surgical history.  Family History  Problem Relation Age of Onset  . Hypertension Mother   . Diabetes Mother   . Cancer Mother   . Hypertension Father   . Diabetes Father     History  Substance Use Topics  . Smoking status: Never Smoker   . Smokeless tobacco: Never Used  . Alcohol Use: No    Allergies: No Known Allergies  Prescriptions prior to admission  Medication Sig Dispense Refill  . labetalol (NORMODYNE) 200 MG tablet Take 200 mg by mouth 2 (two) times daily.      . Prenatal Vit-Fe Fumarate-FA (PRENATAL MULTIVITAMIN) TABS Take 1 tablet by mouth daily.        Review of Systems  Eyes: Positive for blurred vision.  Gastrointestinal: Negative for nausea, vomiting and abdominal pain.  Genitourinary:       No vaginal discharge. No vaginal bleeding. No dysuria.  Neurological: Positive for headaches.   Physical Exam   Blood pressure 167/107, pulse 87, temperature 98 F (36.7 C), temperature source Oral, resp. rate 18, height 5\' 5"  (1.651 m), weight 102.513 kg (226 lb), last menstrual period 02/01/2012, SpO2 100.00%.  Physical Exam  Nursing note and vitals reviewed. Constitutional: She is oriented to person, place, and time. She  appears well-developed and well-nourished.  HENT:  Head: Normocephalic.  Eyes: EOM are normal.  Neck: Neck supple.  GI: Soft. There is no tenderness.       On fetal monitor.  Occasional contraction.  FHT reactive.  Musculoskeletal: Normal range of motion.  Neurological: She is alert and oriented to person, place, and time.  Skin: Skin is warm and dry.  Psychiatric: She has a normal mood and affect.    MAU Course  Procedures Results for orders placed during the hospital encounter of 09/05/12 (from the past 24 hour(s))  URINALYSIS, ROUTINE W REFLEX MICROSCOPIC     Status: Abnormal   Collection Time   09/05/12  7:50 PM      Component Value Range   Color, Urine YELLOW  YELLOW   APPearance CLEAR  CLEAR   Specific Gravity, Urine >1.030 (*) 1.005 - 1.030   pH 6.0  5.0 - 8.0   Glucose, UA NEGATIVE  NEGATIVE mg/dL   Hgb urine dipstick NEGATIVE  NEGATIVE   Bilirubin Urine NEGATIVE  NEGATIVE   Ketones, ur NEGATIVE  NEGATIVE mg/dL   Protein, ur NEGATIVE  NEGATIVE mg/dL   Urobilinogen, UA 1.0  0.0 - 1.0 mg/dL   Nitrite NEGATIVE  NEGATIVE   Leukocytes, UA NEGATIVE  NEGATIVE  MDM 2032  Consult with Dr. Ambrose Mantle re: plan of care - IV labetalol for elevated BP and Tylenol PO for headache.  Will come to see client.  Assessment and Plan  Elevated BP at [redacted] weeks gestation  Plan IV labetalol Tylenol PO Dr. Ambrose Mantle to come see client.  [name] 09/05/2012, 8:32 PM

## 2012-09-06 ENCOUNTER — Encounter (HOSPITAL_COMMUNITY): Payer: Self-pay | Admitting: Anesthesiology

## 2012-09-06 ENCOUNTER — Inpatient Hospital Stay (HOSPITAL_COMMUNITY): Payer: 59 | Admitting: Anesthesiology

## 2012-09-06 ENCOUNTER — Encounter (HOSPITAL_COMMUNITY): Payer: Self-pay | Admitting: *Deleted

## 2012-09-06 LAB — TYPE AND SCREEN
ABO/RH(D): A POS
Antibody Screen: NEGATIVE

## 2012-09-06 LAB — CBC
HCT: 33.6 % — ABNORMAL LOW (ref 36.0–46.0)
HCT: 32.7 % — ABNORMAL LOW (ref 36.0–46.0)
Hemoglobin: 11.5 g/dL — ABNORMAL LOW (ref 12.0–15.0)
Hemoglobin: 11.2 g/dL — ABNORMAL LOW (ref 12.0–15.0)
MCH: 28.9 pg (ref 26.0–34.0)
MCH: 29.2 pg (ref 26.0–34.0)
MCHC: 34.2 g/dL (ref 30.0–36.0)
MCHC: 34.3 g/dL (ref 30.0–36.0)
MCV: 84.4 fL (ref 78.0–100.0)
MCV: 85.2 fL (ref 78.0–100.0)
Platelets: 158 10*3/uL (ref 150–400)
Platelets: 178 10*3/uL (ref 150–400)
RBC: 3.98 MIL/uL (ref 3.87–5.11)
RBC: 3.84 MIL/uL — ABNORMAL LOW (ref 3.87–5.11)
RDW: 12.6 % (ref 11.5–15.5)
RDW: 12.7 % (ref 11.5–15.5)
WBC: 10.6 10*3/uL — ABNORMAL HIGH (ref 4.0–10.5)
WBC: 12.4 10*3/uL — ABNORMAL HIGH (ref 4.0–10.5)

## 2012-09-06 LAB — URIC ACID: Uric Acid, Serum: 3.4 mg/dL (ref 2.4–7.0)

## 2012-09-06 LAB — ABO/RH: ABO/RH(D): A POS

## 2012-09-06 LAB — RPR: RPR Ser Ql: NONREACTIVE

## 2012-09-06 MED ORDER — OXYCODONE-ACETAMINOPHEN 5-325 MG PO TABS: 1.0000 | ORAL_TABLET | ORAL | Status: DC | PRN

## 2012-09-06 MED ORDER — PRENATAL MULTIVITAMIN CH
1.0000 | ORAL_TABLET | Freq: Every day | ORAL | Status: DC
  Administered 2012-09-06: 1 via ORAL

## 2012-09-06 MED ORDER — LIDOCAINE HCL (PF) 1 % IJ SOLN
INTRAMUSCULAR | Status: DC | PRN
  Administered 2012-09-06 (×2): 4 mL

## 2012-09-06 MED ORDER — DIPHENHYDRAMINE HCL 25 MG PO CAPS: 25.0000 mg | ORAL_CAPSULE | Freq: Four times a day (QID) | ORAL | Status: DC | PRN

## 2012-09-06 MED ORDER — LABETALOL HCL 200 MG PO TABS
200.0000 mg | ORAL_TABLET | Freq: Two times a day (BID) | ORAL | Status: DC
  Administered 2012-09-06 – 2012-09-08 (×5): 200 mg via ORAL
  Filled 2012-09-06 (×5): qty 1

## 2012-09-06 MED ORDER — OXYTOCIN 40 UNITS IN LACTATED RINGERS INFUSION - SIMPLE MED: 62.5000 mL/h | INTRAVENOUS | Status: AC | PRN

## 2012-09-06 MED ORDER — PHENYLEPHRINE 40 MCG/ML (10ML) SYRINGE FOR IV PUSH (FOR BLOOD PRESSURE SUPPORT)
80.0000 ug | PREFILLED_SYRINGE | INTRAVENOUS | Status: DC | PRN
  Filled 2012-09-06 (×2): qty 5

## 2012-09-06 MED ORDER — ONDANSETRON HCL 4 MG/2ML IJ SOLN: 4.0000 mg | INTRAMUSCULAR | Status: DC | PRN

## 2012-09-06 MED ORDER — ZOLPIDEM TARTRATE 5 MG PO TABS: 5.0000 mg | ORAL_TABLET | Freq: Every evening | ORAL | Status: DC | PRN

## 2012-09-06 MED ORDER — SODIUM BICARBONATE 8.4 % IV SOLN
INTRAVENOUS | Status: DC | PRN
  Administered 2012-09-06: 5 mL via EPIDURAL

## 2012-09-06 MED ORDER — NITROGLYCERIN 0.4 MG SL SUBL
SUBLINGUAL_TABLET | SUBLINGUAL | Status: DC | PRN
  Administered 2012-09-06 (×2): 0.4 mg via SUBLINGUAL

## 2012-09-06 MED ORDER — EPHEDRINE 5 MG/ML INJ
10.0000 mg | INTRAVENOUS | Status: DC | PRN
  Filled 2012-09-06 (×2): qty 4

## 2012-09-06 MED ORDER — DIBUCAINE 1 % RE OINT: 1.0000 "application " | TOPICAL_OINTMENT | RECTAL | Status: DC | PRN

## 2012-09-06 MED ORDER — DIPHENHYDRAMINE HCL 50 MG/ML IJ SOLN: 12.5000 mg | INTRAMUSCULAR | Status: DC | PRN

## 2012-09-06 MED ORDER — TETANUS-DIPHTH-ACELL PERTUSSIS 5-2.5-18.5 LF-MCG/0.5 IM SUSP: 0.5000 mL | Freq: Once | INTRAMUSCULAR | Status: DC

## 2012-09-06 MED ORDER — EPHEDRINE 5 MG/ML INJ: 10.0000 mg | INTRAVENOUS | Status: DC | PRN

## 2012-09-06 MED ORDER — LACTATED RINGERS IV SOLN: 500.0000 mL | Freq: Once | INTRAVENOUS | Status: DC

## 2012-09-06 MED ORDER — BENZOCAINE-MENTHOL 20-0.5 % EX AERO: 1.0000 "application " | INHALATION_SPRAY | CUTANEOUS | Status: DC | PRN

## 2012-09-06 MED ORDER — SENNOSIDES-DOCUSATE SODIUM 8.6-50 MG PO TABS: 2.0000 | ORAL_TABLET | Freq: Every day | ORAL | Status: DC

## 2012-09-06 MED ORDER — LACTATED RINGERS IV SOLN: INTRAVENOUS | Status: AC

## 2012-09-06 MED ORDER — MEASLES, MUMPS & RUBELLA VAC ~~LOC~~ INJ: 0.5000 mL | INJECTION | Freq: Once | SUBCUTANEOUS | Status: DC

## 2012-09-06 MED ORDER — PHENYLEPHRINE 40 MCG/ML (10ML) SYRINGE FOR IV PUSH (FOR BLOOD PRESSURE SUPPORT): 80.0000 ug | PREFILLED_SYRINGE | INTRAVENOUS | Status: DC | PRN

## 2012-09-06 MED ORDER — IBUPROFEN 600 MG PO TABS
600.0000 mg | ORAL_TABLET | Freq: Four times a day (QID) | ORAL | Status: DC
  Administered 2012-09-06 – 2012-09-08 (×8): 600 mg via ORAL
  Filled 2012-09-06 (×8): qty 1

## 2012-09-06 MED ORDER — WITCH HAZEL-GLYCERIN EX PADS: 1.0000 "application " | MEDICATED_PAD | CUTANEOUS | Status: DC | PRN

## 2012-09-06 MED ORDER — SIMETHICONE 80 MG PO CHEW: 80.0000 mg | CHEWABLE_TABLET | ORAL | Status: DC | PRN

## 2012-09-06 MED ORDER — LANOLIN HYDROUS EX OINT: TOPICAL_OINTMENT | CUTANEOUS | Status: DC | PRN

## 2012-09-06 MED ORDER — LABETALOL HCL 5 MG/ML IV SOLN: 10.0000 mg | Freq: Once | INTRAVENOUS | Status: DC

## 2012-09-06 MED ORDER — FENTANYL 2.5 MCG/ML BUPIVACAINE 1/10 % EPIDURAL INFUSION (WH - ANES)
INTRAMUSCULAR | Status: DC | PRN
  Administered 2012-09-06: 14 mL/h via EPIDURAL

## 2012-09-06 MED ORDER — PRENATAL MULTIVITAMIN CH
1.0000 | ORAL_TABLET | Freq: Every day | ORAL | Status: DC
  Administered 2012-09-07 – 2012-09-08 (×2): 1 via ORAL
  Filled 2012-09-06 (×3): qty 1

## 2012-09-06 MED ORDER — ONDANSETRON HCL 4 MG PO TABS: 4.0000 mg | ORAL_TABLET | ORAL | Status: DC | PRN

## 2012-09-06 MED ORDER — CHLOROPROCAINE HCL 3 % IJ SOLN
INTRAMUSCULAR | Status: DC | PRN
  Administered 2012-09-06 (×2): 5 mL via EPIDURAL

## 2012-09-06 MED ORDER — FENTANYL 2.5 MCG/ML BUPIVACAINE 1/10 % EPIDURAL INFUSION (WH - ANES)
14.0000 mL/h | INTRAMUSCULAR | Status: DC
Start: 2012-09-06 — End: 2012-09-06
  Filled 2012-09-06: qty 125

## 2012-09-06 NOTE — Progress Notes (Signed)
Patient ID: [name], female   DOB: 28-Apr-1986, 26 y.o.   MRN: [id number] Delivery note:   The pt received an epidural and did not get relief so her exam showed the cervix to be 4 cm She rapidly became 7 cm and Dr. Malen Gauze redosed the epidural. She rapidly progressed to full dilatation but had a hard time pushing the baby so with a significant amount of caput showing and the FHR in the 70's, I placed a mityvac, bell cup vacuum and with one contraction and no pop-offs she delivered a living female infant ROA over an intact perineum. Apgars were 9 and 9 at 1 and 5 minutes. The placenta did not deliver so Dr. Rodman Pickle was called and she gave nitroglycerin and redosed the epidural and I was able to place my hand in the uterus and deliver the placenta intact. There was no excessive bleeding and the uterus was normal. EBL 500 cc's.

## 2012-09-06 NOTE — Anesthesia Procedure Notes (Signed)
Epidural Patient location during procedure: OB Start time: 09/06/2012 6:01 AM  Staffing Anesthesiologist: Julizza Sassone A. Performed by: anesthesiologist   Preanesthetic Checklist Completed: patient identified, site marked, surgical consent, pre-op evaluation, timeout performed, IV checked, risks and benefits discussed and monitors and equipment checked  Epidural Patient position: sitting Prep: site prepped and draped and DuraPrep Patient monitoring: continuous pulse ox and blood pressure Approach: midline Injection technique: LOR air  Needle:  Needle type: Tuohy  Needle gauge: 17 G Needle length: 9 cm and 9 Needle insertion depth: 8 cm Catheter type: closed end flexible Catheter size: 19 Gauge Catheter at skin depth: 13 cm Test dose: negative and Other  Assessment Events: blood not aspirated, injection not painful, no injection resistance, negative IV test and no paresthesia  Additional Notes Patient identified. Risks and benefits discussed including failed block, incomplete  Pain control, post dural puncture headache, nerve damage, paralysis, blood pressure Changes, nausea, vomiting, reactions to medications-both toxic and allergic and post Partum back pain. All questions were answered. Patient expressed understanding and wished to proceed. Sterile technique was used throughout procedure. Epidural site was Dressed with sterile barrier dressing. No paresthesias, signs of intravascular injection Or signs of intrathecal spread were encountered. Difficult due to poor position. Patient was more comfortable after the epidural was dosed. Please see RN's note for documentation of vital signs and FHR which are stable.

## 2012-09-06 NOTE — Anesthesia Preprocedure Evaluation (Signed)
Anesthesia Evaluation  Patient identified by MRN, date of birth, ID band Patient awake    Reviewed: Allergy & Precautions, H&P , Patient's Chart, lab work & pertinent test results  Airway Mallampati: III TM Distance: >3 FB Neck ROM: full    Dental No notable dental hx. (+) Teeth Intact   Pulmonary neg pulmonary ROS,  breath sounds clear to auscultation  Pulmonary exam normal       Cardiovascular hypertension, On Medications and On Home Beta Blockers Rhythm:regular Rate:Normal     Neuro/Psych negative neurological ROS  negative psych ROS   GI/Hepatic negative GI ROS, Neg liver ROS,   Endo/Other  Morbid obesity  Renal/GU negative Renal ROS  negative genitourinary   Musculoskeletal   Abdominal   Peds  Hematology negative hematology ROS (+)   Anesthesia Other Findings   Reproductive/Obstetrics (+) Pregnancy                           Anesthesia Physical Anesthesia Plan  ASA: III  Anesthesia Plan: Epidural   Post-op Pain Management:    Induction:   Airway Management Planned:   Additional Equipment:   Intra-op Plan:   Post-operative Plan:   Informed Consent: I have reviewed the patients History and Physical, chart, labs and discussed the procedure including the risks, benefits and alternatives for the proposed anesthesia with the patient or authorized representative who has indicated his/her understanding and acceptance.     Plan Discussed with: Anesthesiologist  Anesthesia Plan Comments:         Anesthesia Quick Evaluation

## 2012-09-06 NOTE — Anesthesia Postprocedure Evaluation (Signed)
  Anesthesia Post-op Note  Patient: [name]  Procedure(s) Performed: * No procedures listed *  Patient Location: Mother/Baby  Anesthesia Type:Epidural  Level of Consciousness: awake, alert  and oriented  Airway and Oxygen Therapy: Patient Spontanous Breathing  Post-op Pain: none  Post-op Assessment: Post-op Vital signs reviewed and Patient's Cardiovascular Status Stable  Post-op Vital Signs: Reviewed and stable  Complications: No apparent anesthesia complications

## 2012-09-06 NOTE — Progress Notes (Signed)
Patient ID: [name], female   DOB: 22-Feb-1986, 26 y.o.   MRN: [id number] Pitocin at 18 mu/ minute and the contractions are q 3 minutes. The cervix is 3-4 cm 50% effaced and the vertex is at -3 station. AROM produced clear fluid.

## 2012-09-06 NOTE — Progress Notes (Signed)
Patient ID: [name], female   DOB: [birth year], 26 y.o.   MRN: [id number] Doing well BP borderline Will watch closely.

## 2012-09-06 NOTE — Progress Notes (Signed)
Vacuumed assisted delivery per Dr Ambrose Mantle

## 2012-09-06 NOTE — Progress Notes (Signed)
Patient ID: [name], female   DOB: Dec 11, 1985, 26 y.o.   MRN: [id number] The BP is better controlled after IV labetalol. The pitocin is at 8 mu/ minute and the contractions are q 3 minutes. The cervix is unchanged at FT 30 % effaced and the vertex is at - 3 station.

## 2012-09-07 LAB — CBC
HCT: 27 % — ABNORMAL LOW (ref 36.0–46.0)
Hemoglobin: 9.1 g/dL — ABNORMAL LOW (ref 12.0–15.0)
MCH: 28.8 pg (ref 26.0–34.0)
MCHC: 33.7 g/dL (ref 30.0–36.0)
MCV: 85.4 fL (ref 78.0–100.0)
Platelets: 161 10*3/uL (ref 150–400)
RBC: 3.16 MIL/uL — ABNORMAL LOW (ref 3.87–5.11)
RDW: 12.9 % (ref 11.5–15.5)
WBC: 9.1 10*3/uL (ref 4.0–10.5)

## 2012-09-07 LAB — COMPREHENSIVE METABOLIC PANEL
ALT: 9 U/L (ref 0–35)
AST: 20 U/L (ref 0–37)
Albumin: 2.1 g/dL — ABNORMAL LOW (ref 3.5–5.2)
Alkaline Phosphatase: 130 U/L — ABNORMAL HIGH (ref 39–117)
BUN: 6 mg/dL (ref 6–23)
CO2: 22 mEq/L (ref 19–32)
Calcium: 8.7 mg/dL (ref 8.4–10.5)
Chloride: 106 mEq/L (ref 96–112)
Creatinine, Ser: 0.69 mg/dL (ref 0.50–1.10)
GFR calc Af Amer: >90 mL/min (ref >90)
GFR calc non Af Amer: >90 mL/min (ref >90)
Glucose, Bld: 114 mg/dL — ABNORMAL HIGH (ref 70–99)
Potassium: 3.5 mEq/L (ref 3.5–5.1)
Sodium: 136 mEq/L (ref 135–145)
Total Bilirubin: 0.2 mg/dL — ABNORMAL LOW (ref 0.3–1.2)
Total Protein: 5.4 g/dL — ABNORMAL LOW (ref 6.0–8.3)

## 2012-09-07 LAB — LACTATE DEHYDROGENASE: LDH: 198 U/L (ref 94–250)

## 2012-09-07 NOTE — Progress Notes (Signed)
Patient ID: [name], female   DOB: May 29, 1986, 26 y.o.   MRN: [id number] #1 afebrile BP stable no complaints

## 2012-09-08 MED ORDER — IBUPROFEN 600 MG PO TABS: 600.0000 mg | ORAL_TABLET | Freq: Four times a day (QID) | ORAL | Status: DC | PRN

## 2012-09-08 MED ORDER — FERROUS SULFATE 325 (65 FE) MG PO TABS: 325.0000 mg | ORAL_TABLET | Freq: Every day | ORAL | Status: DC

## 2012-09-08 MED ORDER — LABETALOL HCL 200 MG PO TABS: 300.0000 mg | ORAL_TABLET | Freq: Two times a day (BID) | ORAL | Status: DC

## 2012-09-08 MED ORDER — OXYCODONE-ACETAMINOPHEN 5-325 MG PO TABS: 1.0000 | ORAL_TABLET | Freq: Four times a day (QID) | ORAL | Status: DC | PRN

## 2012-09-08 NOTE — Discharge Summary (Signed)
[name], [name]            ACCOUNT NO.:  [id number]  MEDICAL RECORD NO.:  [id number]  LOCATION:  9112                          FACILITY:  [facility]  PHYSICIAN:  Malachi Pro. Ambrose Mantle, M.D. DATE OF BIRTH:  08-10-1986  DATE OF ADMISSION:  09/05/2012 DATE OF DISCHARGE:  09/08/2012                              DISCHARGE SUMMARY   This 26 year old black female, para 0-1-0-2 with [facility] September 19, 2012, was admitted because of significantly high blood pressure.  Blood group and type A positive with negative antibody.  Pap smear normal.  Rubella immune.  RPR nonreactive.  Urine culture negative.  Hepatitis B surface antigen negative.  HIV negative.  Hemoglobin electrophoresis AA GC negative.  Chlamydia positive x2, treated with Zithromax.  First trimester screen negative.  Cystic fibrosis negative.  AFP negative. One-hour Glucola was elevated.  Three-hour GTT had one abnormal value, not enough for gestational diabetes mellitus.  The patient had had elevated blood pressures since early November, she was taken 200 mg of labetalol twice a day.  PIH panels were normal.  A 24-hour urine showed less than 200 mg in 24 hours.  On the night of admission, she came to the hospital because her blood pressure was 174/104, blood pressure remained the same in the Maternity Admission Unit.  She complained of some headache, so she was admitted for induction of labor.  The patient was given IV labetalol, blood pressure improved, she was begun on Pitocin.  By 12:25 a.m. on September 06, 2012, Pitocin was at 8 milliunits a minute, contractions every 3 minutes.  Cervix was unchanged at a fingertip 30%, vertex at a -3.  By 4:53 a.m., the Pitocin was at 18 milliunits a minute, contractions every 3 minutes.  Cervix was 3-4 cm, 50%, vertex at a -3.  Artificial rupture of the membranes produced clear fluid.  The patient was allowed to have an epidural.  The epidural did not give her relief, so exam showed the cervix to be  4 cm, she rapidly became 7 cm.  Dr. Malen Gauze re-dosed the epidural.  She then rapidly progressed to full dilatation, but had a hard time pushing the baby.  So with the significant amount of caput showing and the fetal heart rate in the 70s, I placed the Mityvac Bell cup vacuum and with 1 contraction and no pop offs, she delivered a living female infant ROA over an intact perineum.  Apgars were 9 and 9 at 1 and 5 minutes.  Placenta did not deliver.  So, Dr. Rodman Pickle was called and she gave nitroglycerin and re- dosed the epidural and I was able to place my hand in the uterus and delivered the placenta intact.  There was no excessive bleeding and the uterus was normal, blood loss about 500 mL.  Postpartum, the patient did quite well.  Her blood pressure remained slightly elevated, but she had no significant complaints especially no headache.  On the second postpartum day, she is ready for discharge.  Her blood pressure is 149/95, so I am going to increase her labetalol to take 300 mg twice a day.  Her initial hemoglobin was 11.4, hematocrit 33.6, white count 8400, platelet count of 193,000.  SGOT and  PT were 20 and 10 respectively.  BUN was 8, creatinine 0.53 postpartum.  Her SGOT and PT were 20 and 9 respectively.  Her BUN was 6, creatinine 0.69.  Hemoglobin 9.1, hematocrit 27, platelet count of 161,000.  FINAL DIAGNOSES: 1. Intrauterine pregnancy at 38 weeks and 2 days delivered, vertex. 2. Chronic hypertension.  OPERATION:  Vacuum-assisted vaginal delivery, vertex.  FINAL CONDITION:  Improved.  INSTRUCTIONS:  Include our regular discharge instruction booklet as well as the after visit summary.  MEDICATIONS:  Labetalol 300 mg twice a day, Percocet 5/325, and Motrin 600 mg 30 tablets of each 1 every 6 hours as needed for pain, ferrous sulfate 325 mg 1 a day.  The patient is advised to return in 1 week to have her blood pressure observed in the office.  She is advised to take her blood  pressure at home.  Call with blood pressures above 160/105 or if she develops headache or severe epigastric pain.     Malachi Pro. Ambrose Mantle, M.D.     TFH/MEDQ  D:  09/08/2012  T:  09/08/2012  Job:  [id number]

## 2012-09-08 NOTE — Progress Notes (Signed)
Patient ID: [name], female   DOB: February 06, 1986, 26 y.o.   MRN: [id number] #[id number] afebrile BP 149/95 For D/C with slight increase in labetalol.

## 2013-05-31 ENCOUNTER — Emergency Department (HOSPITAL_COMMUNITY)
Admission: EM | Admit: 2013-05-31 | Discharge: 2013-06-01 | Disposition: A | Discharge status: Home / Self Care | Payer: 59 | Attending: Emergency Medicine | Admitting: Emergency Medicine

## 2013-05-31 ENCOUNTER — Encounter (HOSPITAL_COMMUNITY): Payer: Self-pay | Admitting: Adult Health

## 2013-05-31 DIAGNOSIS — Z8639 Personal history of other endocrine, nutritional and metabolic disease: Secondary | ICD-10-CM | POA: Insufficient documentation

## 2013-05-31 DIAGNOSIS — R42 Dizziness and giddiness: Secondary | ICD-10-CM | POA: Insufficient documentation

## 2013-05-31 DIAGNOSIS — Z3202 Encounter for pregnancy test, result negative: Secondary | ICD-10-CM | POA: Insufficient documentation

## 2013-05-31 DIAGNOSIS — Z862 Personal history of diseases of the blood and blood-forming organs and certain disorders involving the immune mechanism: Secondary | ICD-10-CM | POA: Insufficient documentation

## 2013-05-31 DIAGNOSIS — I10 Essential (primary) hypertension: Secondary | ICD-10-CM | POA: Insufficient documentation

## 2013-05-31 LAB — BASIC METABOLIC PANEL
BUN: 13 mg/dL (ref 6–23)
CO2: 25 mEq/L (ref 19–32)
Calcium: 9.1 mg/dL (ref 8.4–10.5)
Chloride: 102 mEq/L (ref 96–112)
Creatinine, Ser: 0.8 mg/dL (ref 0.50–1.10)
GFR calc Af Amer: >90 mL/min (ref >90)
GFR calc non Af Amer: >90 mL/min (ref >90)
Glucose, Bld: 93 mg/dL (ref 70–99)
Potassium: 3.7 mEq/L (ref 3.5–5.1)
Sodium: 137 mEq/L (ref 135–145)

## 2013-05-31 LAB — CBC WITH DIFFERENTIAL/PLATELET
Basophils Absolute: 0 10*3/uL (ref 0.0–0.1)
Basophils Relative: 0 % (ref 0–1)
Eosinophils Absolute: 0.3 10*3/uL (ref 0.0–0.7)
Eosinophils Relative: 3 % (ref 0–5)
HCT: 36.7 % (ref 36.0–46.0)
Hemoglobin: 12.9 g/dL (ref 12.0–15.0)
Lymphocytes Relative: 34 % (ref 12–46)
Lymphs Abs: 3.4 10*3/uL (ref 0.7–4.0)
MCH: 30.2 pg (ref 26.0–34.0)
MCHC: 35.1 g/dL (ref 30.0–36.0)
MCV: 85.9 fL (ref 78.0–100.0)
Monocytes Absolute: 0.5 10*3/uL (ref 0.1–1.0)
Monocytes Relative: 5 % (ref 3–12)
Neutro Abs: 5.9 10*3/uL (ref 1.7–7.7)
Neutrophils Relative %: 58 % (ref 43–77)
Platelets: 267 10*3/uL (ref 150–400)
RBC: 4.27 MIL/uL (ref 3.87–5.11)
RDW: 12.3 % (ref 11.5–15.5)
WBC: 10.2 10*3/uL (ref 4.0–10.5)

## 2013-05-31 LAB — POCT PREGNANCY, URINE: Preg Test, Ur: NEGATIVE

## 2013-05-31 NOTE — ED Notes (Signed)
Presents with nausea and intermittent dizziness for 3 months. Pt denies pain. LMP today. Pt believes she may be pregnant because her period was late.  Reports dizziness is worse when standing up and sitting down and drinking water make dizziness better.

## 2013-06-01 MED ORDER — ONDANSETRON 4 MG PO TBDP
4.0000 mg | ORAL_TABLET | Freq: Once | ORAL | Status: AC
  Administered 2013-06-01: 4 mg via ORAL
  Filled 2013-06-01: qty 1

## 2013-06-01 MED ORDER — MECLIZINE HCL 50 MG PO TABS: 25.0000 mg | ORAL_TABLET | Freq: Three times a day (TID) | ORAL | Status: DC | PRN

## 2013-06-01 MED ORDER — PROMETHAZINE HCL 25 MG PO TABS: 25.0000 mg | ORAL_TABLET | Freq: Four times a day (QID) | ORAL | Status: DC | PRN

## 2013-06-01 NOTE — ED Provider Notes (Signed)
CSN: [id number]     Arrival date & time 05/31/13  2158 History     First MD Initiated Contact with Patient 06/01/13 [phone]     Chief Complaint  Patient presents with  . Nausea   (Consider location/radiation/quality/duration/timing/severity/associated sxs/prior Treatment) HPI  [name] is a 27 y.o. female complaining of nausea with no emesis and intermittent vertiginous sensation for 3 months. She states that patient's are stable in intensity and frequency she comes to the ED today because she has time. Patient reports that dizzy sensations last for approximately 3-4 minutes, they are not triggered by head movement. She states that they're worse when she goes to standi up and and when she sitts down. She states that drinking water makes it better. She denies polydipsia and endorses polyuria. Patient states she believes she may be pregnant because her period was late; she started her period today.  Past Medical History  Diagnosis Date  . Hypertension   . No pertinent past medical history   . High cholesterol    History reviewed. No pertinent past surgical history. Family History  Problem Relation Age of Onset  . Hypertension Mother   . Diabetes Mother   . Cancer Mother   . Hypertension Father   . Diabetes Father    History  Substance Use Topics  . Smoking status: Never Smoker   . Smokeless tobacco: Never Used  . Alcohol Use: No   OB History   Grav Para Term Preterm Abortions TAB SAB Ect Mult Living   2 2 1 1      3      Review of Systems 10 systems reviewed and found to be negative, except as noted in the HPI   Allergies  Review of patient's allergies indicates no known allergies.  Home Medications  No current outpatient prescriptions on file. BP 152/101  Pulse 79  Temp(Src) 98.2 F (36.8 C) (Oral)  Resp 16  SpO2 97% Physical Exam  Nursing note and vitals reviewed. Constitutional: She is oriented to person, place, and time. She appears well-developed and  well-nourished. No distress.  HENT:  Head: Normocephalic.  Mouth/Throat: Oropharynx is clear and moist.  Eyes: Conjunctivae and EOM are normal. Pupils are equal, round, and reactive to light.  Neck: Normal range of motion. Neck supple.  Cardiovascular: Normal rate.   Pulmonary/Chest: Effort normal and breath sounds normal. No stridor. No respiratory distress. She has no wheezes. She has no rales. She exhibits no tenderness.  Abdominal: Soft. Bowel sounds are normal. She exhibits no distension and no mass. There is no tenderness. There is no rebound and no guarding.  Musculoskeletal: Normal range of motion. She exhibits no edema.  Neurological: She is alert and oriented to person, place, and time.  Cranial nerves III through XII intact, strength 5 out of 5x4 extremities, negative pronator drift, finger to nose and heel-to-shin coordinated, sensation intact to pinprick and light touch, gait is coordinated and Romberg is negative.   Dix-Hallpike is negative bilaterally  Psychiatric: She has a normal mood and affect.    ED Course   Procedures (including critical care time)  Labs Reviewed  CBC WITH DIFFERENTIAL  BASIC METABOLIC PANEL  POCT PREGNANCY, URINE   No results found. 1. Vertigo     MDM   Filed Vitals:   05/31/13 2203 06/01/13 0105  BP: 152/101 125/59  Pulse: 79 80  Temp: 98.2 F (36.8 C) 98.2 F (36.8 C)  TempSrc: Oral Oral  Resp: 16 18  SpO2: 97%  100%     [name] is a 27 y.o. female complaining of vertiginous sensation and nausea. Physical exam shows no abnormalities, patient is not having the sensation at this time. On exam patient states that she really just need a pregnancy test.  Medications  ondansetron (ZOFRAN-ODT) disintegrating tablet 4 mg (4 mg Oral Given 06/01/13 0134)    Pt is hemodynamically stable, appropriate for, and amenable to discharge at this time. Pt verbalized understanding and agrees with care plan. All questions answered.  Outpatient follow-up and specific return precautions discussed.    Discharge Medication List as of 06/01/2013  1:40 AM    START taking these medications   Details  meclizine (ANTIVERT) 50 MG tablet Take 0.5 tablets (25 mg total) by mouth 3 (three) times daily as needed., Starting 06/01/2013, Until Discontinued, Print    promethazine (PHENERGAN) 25 MG tablet Take 1 tablet (25 mg total) by mouth every 6 (six) hours as needed for nausea., Starting 06/01/2013, Until Discontinued, Print        Note: Portions of this report may have been transcribed using voice recognition software. Every effort was made to ensure accuracy; however, inadvertent computerized transcription errors may be present    Wynetta Emery, PA-C 06/01/13 [phone]

## 2013-06-01 NOTE — ED Notes (Signed)
Patient states she is feeling better.  No c/o nausea.  Discharge instructions given voiced understanding.

## 2013-06-02 NOTE — ED Provider Notes (Signed)
Medical screening examination/treatment/procedure(s) were performed by non-physician practitioner and as supervising physician I was immediately available for consultation/collaboration.  Sunnie Nielsen, MD 06/02/13 [phone]

## 2013-12-02 ENCOUNTER — Encounter (HOSPITAL_COMMUNITY): Payer: Self-pay | Admitting: Family

## 2013-12-02 ENCOUNTER — Inpatient Hospital Stay (HOSPITAL_COMMUNITY)
Admission: AD | Admit: 2013-12-02 | Discharge: 2013-12-02 | Disposition: A | Discharge status: Home / Self Care | Payer: Medicaid Other | Source: Ambulatory Visit | Attending: Obstetrics and Gynecology | Admitting: Obstetrics and Gynecology

## 2013-12-02 DIAGNOSIS — R112 Nausea with vomiting, unspecified: Secondary | ICD-10-CM | POA: Insufficient documentation

## 2013-12-02 DIAGNOSIS — I10 Essential (primary) hypertension: Secondary | ICD-10-CM | POA: Insufficient documentation

## 2013-12-02 DIAGNOSIS — Z3201 Encounter for pregnancy test, result positive: Secondary | ICD-10-CM | POA: Insufficient documentation

## 2013-12-02 DIAGNOSIS — O219 Vomiting of pregnancy, unspecified: Secondary | ICD-10-CM

## 2013-12-02 LAB — URINALYSIS, ROUTINE W REFLEX MICROSCOPIC
Bilirubin Urine: NEGATIVE
Glucose, UA: NEGATIVE mg/dL
Hgb urine dipstick: NEGATIVE
Ketones, ur: NEGATIVE mg/dL
Nitrite: NEGATIVE
Protein, ur: NEGATIVE mg/dL
Specific Gravity, Urine: 1.02 (ref 1.005–1.030)
Urobilinogen, UA: 2 mg/dL — ABNORMAL HIGH (ref 0.0–1.0)
pH: 7 (ref 5.0–8.0)

## 2013-12-02 LAB — URINE MICROSCOPIC-ADD ON

## 2013-12-02 LAB — POCT PREGNANCY, URINE: Preg Test, Ur: POSITIVE — AB

## 2013-12-02 MED ORDER — ONDANSETRON 8 MG PO TBDP
8.0000 mg | ORAL_TABLET | Freq: Once | ORAL | Status: AC
  Administered 2013-12-02: 8 mg via ORAL
  Filled 2013-12-02: qty 1

## 2013-12-02 MED ORDER — ONDANSETRON 8 MG PO TBDP: 8.0000 mg | ORAL_TABLET | Freq: Three times a day (TID) | ORAL | Status: DC | PRN

## 2013-12-02 NOTE — Discharge Instructions (Signed)
Your pregnancy test is positive.  No smoking, no drugs, no alcohol.  Take a prenatal vitamin one by mouth every day.  Eat small frequent snacks to avoid nausea.  Begin prenatal care as soon as possible. Get your prescription filled and take all as directed.

## 2013-12-02 NOTE — MAU Provider Note (Signed)
History     CSN: [id number]  Arrival date and time: 12/02/13 2778   First Provider Initiated Contact with Patient 12/02/13 1058      Chief Complaint  Patient presents with  . Nausea  . Abdominal Cramping   HPI MIGUEL MEDAL 28 y.o.  Having difficulty with vomiting.  Has had vomiting for 2-3 days.  Had diarrhea yesterday.  No diarrhea today.  No vomiting today but has not had anything to eat today.    OB History   Grav Para Term Preterm Abortions TAB SAB Ect Mult Living   2 2 1 1      3       Past Medical History  Diagnosis Date  . Hypertension   . No pertinent past medical history   . High cholesterol     History reviewed. No pertinent past surgical history.  Family History  Problem Relation Age of Onset  . Hypertension Mother   . Diabetes Mother   . Cancer Mother   . Hypertension Father   . Diabetes Father     History  Substance Use Topics  . Smoking status: Never Smoker   . Smokeless tobacco: Never Used  . Alcohol Use: No    Allergies: No Known Allergies  Prescriptions prior to admission  Medication Sig Dispense Refill  . guaiFENesin (ROBITUSSIN) 100 MG/5ML liquid Take 400 mg by mouth 3 (three) times daily as needed for cough or congestion.        Review of Systems  Constitutional: Negative for fever.  Gastrointestinal: Positive for nausea, vomiting and diarrhea. Negative for abdominal pain.  Genitourinary: Negative for dysuria.       No vaginal bleeding.   Physical Exam   Blood pressure 141/88, pulse 68, temperature 98.3 F (36.8 C), temperature source Oral, resp. rate 15, unknown if currently breastfeeding.  Physical Exam  Nursing note and vitals reviewed. Constitutional: She is oriented to person, place, and time. She appears well-developed and well-nourished.  HENT:  Head: Normocephalic.  Eyes: EOM are normal.  Neck: Neck supple.  Respiratory: Effort normal.  GI: Soft. There is no tenderness.  Musculoskeletal: Normal range of  motion.  Neurological: She is alert and oriented to person, place, and time.  Skin: Skin is warm and dry.  Psychiatric: She has a normal mood and affect.    MAU Course  Procedures  MDM Results for orders placed during the hospital encounter of 12/02/13 (from the past 24 hour(s))  URINALYSIS, ROUTINE W REFLEX MICROSCOPIC     Status: Abnormal   Collection Time    12/02/13 10:15 AM      Result Value Range   Color, Urine YELLOW  YELLOW   APPearance CLEAR  CLEAR   Specific Gravity, Urine 1.020  1.005 - 1.030   pH 7.0  5.0 - 8.0   Glucose, UA NEGATIVE  NEGATIVE mg/dL   Hgb urine dipstick NEGATIVE  NEGATIVE   Bilirubin Urine NEGATIVE  NEGATIVE   Ketones, ur NEGATIVE  NEGATIVE mg/dL   Protein, ur NEGATIVE  NEGATIVE mg/dL   Urobilinogen, UA 2.0 (*) 0.0 - 1.0 mg/dL   Nitrite NEGATIVE  NEGATIVE   Leukocytes, UA SMALL (*) NEGATIVE  URINE MICROSCOPIC-ADD ON     Status: Abnormal   Collection Time    12/02/13 10:15 AM      Result Value Range   Squamous Epithelial / LPF MANY (*) RARE   WBC, UA 3-6  <3 WBC/hpf   Bacteria, UA RARE  RARE   Urine-Other  MUCOUS PRESENT    POCT PREGNANCY, URINE     Status: Abnormal   Collection Time    12/02/13 10:25 AM      Result Value Range   Preg Test, Ur POSITIVE (*) NEGATIVE     Assessment and Plan  Vomiting Early pregnancy  Plan Given Zofran ODT in MAU and was able to be comfortable and sleep afterwards. Will discharge. Your pregnancy test is positive.  No smoking, no drugs, no alcohol.  Take a prenatal vitamin one by mouth every day.  Eat small frequent snacks to avoid nausea.  Begin prenatal care as soon as possible.   Gerik Coberly 12/02/2013, 11:06 AM

## 2013-12-02 NOTE — MAU Note (Addendum)
28 yo, presents with c/o nausea and abdominal cramping for last 7 days. Reports emesis x 3 occurences yesterday, diarrhea yesterday, none today. Daughter sick with stomach virus since Thursday.  Reports +HPT. Denies VB. Scheduled appt w Dr. Ulanda Edison for first prenatal visit on 12/17/13.

## 2014-06-26 ENCOUNTER — Emergency Department (HOSPITAL_COMMUNITY)
Admission: EM | Admit: 2014-06-26 | Discharge: 2014-06-26 | Disposition: A | Discharge status: Home / Self Care | Payer: Self-pay | Attending: Emergency Medicine | Admitting: Emergency Medicine

## 2014-06-26 ENCOUNTER — Encounter (HOSPITAL_COMMUNITY): Payer: Self-pay | Admitting: Emergency Medicine

## 2014-06-26 DIAGNOSIS — Z8639 Personal history of other endocrine, nutritional and metabolic disease: Secondary | ICD-10-CM | POA: Insufficient documentation

## 2014-06-26 DIAGNOSIS — N898 Other specified noninflammatory disorders of vagina: Secondary | ICD-10-CM | POA: Insufficient documentation

## 2014-06-26 DIAGNOSIS — M545 Low back pain, unspecified: Secondary | ICD-10-CM | POA: Insufficient documentation

## 2014-06-26 DIAGNOSIS — Z3202 Encounter for pregnancy test, result negative: Secondary | ICD-10-CM | POA: Insufficient documentation

## 2014-06-26 DIAGNOSIS — R109 Unspecified abdominal pain: Secondary | ICD-10-CM | POA: Insufficient documentation

## 2014-06-26 DIAGNOSIS — Z862 Personal history of diseases of the blood and blood-forming organs and certain disorders involving the immune mechanism: Secondary | ICD-10-CM | POA: Insufficient documentation

## 2014-06-26 DIAGNOSIS — R350 Frequency of micturition: Secondary | ICD-10-CM | POA: Insufficient documentation

## 2014-06-26 DIAGNOSIS — I10 Essential (primary) hypertension: Secondary | ICD-10-CM | POA: Insufficient documentation

## 2014-06-26 LAB — COMPREHENSIVE METABOLIC PANEL
ALT: 8 U/L (ref 0–35)
AST: 17 U/L (ref 0–37)
Albumin: 3.5 g/dL (ref 3.5–5.2)
Alkaline Phosphatase: 73 U/L (ref 39–117)
Anion gap: 12 (ref 5–15)
BUN: 10 mg/dL (ref 6–23)
CO2: 23 mEq/L (ref 19–32)
Calcium: 8.9 mg/dL (ref 8.4–10.5)
Chloride: 106 mEq/L (ref 96–112)
Creatinine, Ser: 0.91 mg/dL (ref 0.50–1.10)
GFR calc Af Amer: >90 mL/min (ref >90)
GFR calc non Af Amer: 86 mL/min — ABNORMAL LOW (ref >90)
Glucose, Bld: 88 mg/dL (ref 70–99)
Potassium: 4.2 mEq/L (ref 3.7–5.3)
Sodium: 141 mEq/L (ref 137–147)
Total Bilirubin: 0.5 mg/dL (ref 0.3–1.2)
Total Protein: 6.8 g/dL (ref 6.0–8.3)

## 2014-06-26 LAB — CBC WITH DIFFERENTIAL/PLATELET
Basophils Absolute: 0.1 10*3/uL (ref 0.0–0.1)
Basophils Relative: 1 % (ref 0–1)
Eosinophils Absolute: 0.2 10*3/uL (ref 0.0–0.7)
Eosinophils Relative: 3 % (ref 0–5)
HCT: 36.7 % (ref 36.0–46.0)
Hemoglobin: 12.6 g/dL (ref 12.0–15.0)
Lymphocytes Relative: 22 % (ref 12–46)
Lymphs Abs: 1.7 10*3/uL (ref 0.7–4.0)
MCH: 29.8 pg (ref 26.0–34.0)
MCHC: 34.3 g/dL (ref 30.0–36.0)
MCV: 86.8 fL (ref 78.0–100.0)
Monocytes Absolute: 0.5 10*3/uL (ref 0.1–1.0)
Monocytes Relative: 7 % (ref 3–12)
Neutro Abs: 5.2 10*3/uL (ref 1.7–7.7)
Neutrophils Relative %: 67 % (ref 43–77)
Platelets: 237 10*3/uL (ref 150–400)
RBC: 4.23 MIL/uL (ref 3.87–5.11)
RDW: 12.4 % (ref 11.5–15.5)
WBC: 7.8 10*3/uL (ref 4.0–10.5)

## 2014-06-26 LAB — WET PREP, GENITAL
Clue Cells Wet Prep HPF POC: NONE SEEN
Trich, Wet Prep: NONE SEEN
Yeast Wet Prep HPF POC: NONE SEEN

## 2014-06-26 LAB — URINALYSIS, ROUTINE W REFLEX MICROSCOPIC
Bilirubin Urine: NEGATIVE
Glucose, UA: NEGATIVE mg/dL
Hgb urine dipstick: NEGATIVE
Ketones, ur: NEGATIVE mg/dL
Leukocytes, UA: NEGATIVE
Nitrite: NEGATIVE
Protein, ur: NEGATIVE mg/dL
Specific Gravity, Urine: 1.019 (ref 1.005–1.030)
Urobilinogen, UA: 1 mg/dL (ref 0.0–1.0)
pH: 8 (ref 5.0–8.0)

## 2014-06-26 LAB — POC URINE PREG, ED: Preg Test, Ur: NEGATIVE

## 2014-06-26 LAB — LIPASE, BLOOD: Lipase: 27 U/L (ref 11–59)

## 2014-06-26 MED ORDER — ONDANSETRON HCL 4 MG/2ML IJ SOLN
4.0000 mg | Freq: Once | INTRAMUSCULAR | Status: AC
  Administered 2014-06-26: 4 mg via INTRAVENOUS
  Filled 2014-06-26: qty 2

## 2014-06-26 MED ORDER — MORPHINE SULFATE 4 MG/ML IJ SOLN
4.0000 mg | Freq: Once | INTRAMUSCULAR | Status: AC
  Administered 2014-06-26: 4 mg via INTRAVENOUS
  Filled 2014-06-26: qty 1

## 2014-06-26 NOTE — ED Notes (Signed)
PT c/o back pain and diffuse belly pain and urinary frequency for longer than 3 weeks. Denies nausea and vomiting

## 2014-06-26 NOTE — Discharge Instructions (Signed)

## 2014-06-26 NOTE — ED Notes (Signed)
PT has not had period since May; had procedural abortion in July.

## 2014-06-26 NOTE — ED Provider Notes (Signed)
CSN: [id number]     Arrival date & time 06/26/14  1111 History   First MD Initiated Contact with Patient 06/26/14 1122     Chief Complaint  Patient presents with  . Abdominal Pain  . Urinary Frequency     (Consider location/radiation/quality/duration/timing/severity/associated sxs/prior Treatment) HPI Comments: Patient with history of hypertension and hyperlipidemia, presents emergency department with chief complaint of abdominal pain, low back pain, and urinary frequency. She states the symptoms have been ongoing for the past 3 weeks. She denies any associated fevers, chills, nausea, vomiting, or dysuria. She states that she has noticed new vaginal discharge. She has tried taking Tylenol with minimal relief. Symptoms are aggravated when she lies down, they're better when she is up and moving. She denies any prior abdominal surgical history.  The history is provided by the patient. No language interpreter was used.    Past Medical History  Diagnosis Date  . Hypertension   . No pertinent past medical history   . High cholesterol    History reviewed. No pertinent past surgical history. Family History  Problem Relation Age of Onset  . Hypertension Mother   . Diabetes Mother   . Cancer Mother   . Hypertension Father   . Diabetes Father    History  Substance Use Topics  . Smoking status: Never Smoker   . Smokeless tobacco: Never Used  . Alcohol Use: No   OB History   Grav Para Term Preterm Abortions TAB SAB Ect Mult Living   2 2 1 1      3      Review of Systems  Constitutional: Negative for fever and chills.  Gastrointestinal: Positive for abdominal pain. Negative for nausea, vomiting and diarrhea.  Genitourinary: Positive for frequency and vaginal discharge.  Musculoskeletal: Positive for back pain.  All other systems reviewed and are negative.     Allergies  Review of patient's allergies indicates no known allergies.  Home Medications   Prior to Admission  medications   Not on File   BP 134/88  Pulse 75  Temp(Src) 98 F (36.7 C) (Oral)  Resp 16  Ht 5\' 6"  (1.676 m)  SpO2 97%  LMP 03/26/2014 Physical Exam  Nursing note and vitals reviewed. Constitutional: She is oriented to person, place, and time. She appears well-developed and well-nourished.  HENT:  Head: Normocephalic and atraumatic.  Eyes: Conjunctivae and EOM are normal. Pupils are equal, round, and reactive to light.  Neck: Normal range of motion. Neck supple.  Cardiovascular: Normal rate and regular rhythm.  Exam reveals no gallop and no friction rub.   No murmur heard. Pulmonary/Chest: Effort normal and breath sounds normal. No respiratory distress. She has no wheezes. She has no rales. She exhibits no tenderness.  Abdominal: Soft. Bowel sounds are normal. She exhibits no distension and no mass. There is no tenderness. There is no rebound and no guarding.  Lower abdomen is mildly ttp diffusely, no focal abdominal tenderness, no rebound, no guarding, no focal tenderness at McBurney's point, no right upper quadrant tenderness, or Murphy's sign  Genitourinary:  Pelvic exam chaperoned by female ER tech, no right or left adnexal tenderness, no uterine tenderness, mild vaginal discharge, no bleeding, no CMT or friability, no foreign body, no injury to the external genitalia, no other significant findings   Musculoskeletal: Normal range of motion. She exhibits no edema and no tenderness.  Neurological: She is alert and oriented to person, place, and time.  Skin: Skin is warm and dry.  Psychiatric:  She has a normal mood and affect. Her behavior is normal. Judgment and thought content normal.    ED Course  Procedures (including critical care time) Results for orders placed during the hospital encounter of 06/26/14  WET PREP, GENITAL      Result Value Ref Range   Yeast Wet Prep HPF POC NONE SEEN  NONE SEEN   Trich, Wet Prep NONE SEEN  NONE SEEN   Clue Cells Wet Prep HPF POC NONE  SEEN  NONE SEEN   WBC, Wet Prep HPF POC MODERATE (*) NONE SEEN  CBC WITH DIFFERENTIAL      Result Value Ref Range   WBC 7.8  4.0 - 10.5 K/uL   RBC 4.23  3.87 - 5.11 MIL/uL   Hemoglobin 12.6  12.0 - 15.0 g/dL   HCT 36.7  36.0 - 46.0 %   MCV 86.8  78.0 - 100.0 fL   MCH 29.8  26.0 - 34.0 pg   MCHC 34.3  30.0 - 36.0 g/dL   RDW 12.4  11.5 - 15.5 %   Platelets 237  150 - 400 K/uL   Neutrophils Relative % 67  43 - 77 %   Neutro Abs 5.2  1.7 - 7.7 K/uL   Lymphocytes Relative 22  12 - 46 %   Lymphs Abs 1.7  0.7 - 4.0 K/uL   Monocytes Relative 7  3 - 12 %   Monocytes Absolute 0.5  0.1 - 1.0 K/uL   Eosinophils Relative 3  0 - 5 %   Eosinophils Absolute 0.2  0.0 - 0.7 K/uL   Basophils Relative 1  0 - 1 %   Basophils Absolute 0.1  0.0 - 0.1 K/uL  COMPREHENSIVE METABOLIC PANEL      Result Value Ref Range   Sodium 141  137 - 147 mEq/L   Potassium 4.2  3.7 - 5.3 mEq/L   Chloride 106  96 - 112 mEq/L   CO2 23  19 - 32 mEq/L   Glucose, Bld 88  70 - 99 mg/dL   BUN 10  6 - 23 mg/dL   Creatinine, Ser 0.91  0.50 - 1.10 mg/dL   Calcium 8.9  8.4 - 10.5 mg/dL   Total Protein 6.8  6.0 - 8.3 g/dL   Albumin 3.5  3.5 - 5.2 g/dL   AST 17  0 - 37 U/L   ALT 8  0 - 35 U/L   Alkaline Phosphatase 73  39 - 117 U/L   Total Bilirubin 0.5  0.3 - 1.2 mg/dL   GFR calc non Af Amer 86 (*) >90 mL/min   GFR calc Af Amer >90  >90 mL/min   Anion gap 12  5 - 15  LIPASE, BLOOD      Result Value Ref Range   Lipase 27  11 - 59 U/L  URINALYSIS, ROUTINE W REFLEX MICROSCOPIC      Result Value Ref Range   Color, Urine YELLOW  YELLOW   APPearance CLEAR  CLEAR   Specific Gravity, Urine 1.019  1.005 - 1.030   pH 8.0  5.0 - 8.0   Glucose, UA NEGATIVE  NEGATIVE mg/dL   Hgb urine dipstick NEGATIVE  NEGATIVE   Bilirubin Urine NEGATIVE  NEGATIVE   Ketones, ur NEGATIVE  NEGATIVE mg/dL   Protein, ur NEGATIVE  NEGATIVE mg/dL   Urobilinogen, UA 1.0  0.0 - 1.0 mg/dL   Nitrite NEGATIVE  NEGATIVE   Leukocytes, UA NEGATIVE   NEGATIVE  POC URINE PREG,  ED      Result Value Ref Range   Preg Test, Ur NEGATIVE  NEGATIVE   No results found.   Imaging Review No results found.   EKG Interpretation None      MDM   Final diagnoses:  Abdominal pain, unspecified abdominal location  Urinary frequency    Patient with abdominal pain, back pain, urinary frequency. Check labs, UA, and pelvic. Treat pain, and will reassess.  Pelvic exam remarkable for mild discharge, no adnexal or uterine tenderness.  Labs and workup a reassuring. Abdomen is soft and nontender, mild discomfort but no actual tenderness. Patient states that she is feeling well. I will recommend primary care followup. Patient understands and agrees with the plan. She is stable and ready for discharge. Return precautions given.  Patient instructed to return for:  New or worsening symptoms, including, increased abdominal pain, especially pain that localizes to one side, bloody vomit, bloody diarrhea, fever >101, and intractable vomiting.     Montine Circle, PA-C 06/26/14 1314

## 2014-06-27 LAB — GC/CHLAMYDIA PROBE AMP
CT Probe RNA: NEGATIVE
GC Probe RNA: NEGATIVE

## 2014-06-27 NOTE — ED Provider Notes (Signed)
Medical screening examination/treatment/procedure(s) were performed by non-physician practitioner and as supervising physician I was immediately available for consultation/collaboration.   EKG Interpretation None        Houston Siren III, MD 06/27/14 [id number]

## 2014-09-02 ENCOUNTER — Encounter (HOSPITAL_COMMUNITY): Payer: Self-pay | Admitting: Emergency Medicine

## 2015-01-10 ENCOUNTER — Encounter (HOSPITAL_COMMUNITY): Payer: Self-pay

## 2015-01-10 ENCOUNTER — Emergency Department (HOSPITAL_COMMUNITY)
Admission: EM | Admit: 2015-01-10 | Discharge: 2015-01-10 | Disposition: A | Discharge status: Home / Self Care | Payer: 59 | Attending: Emergency Medicine | Admitting: Emergency Medicine

## 2015-01-10 ENCOUNTER — Emergency Department (HOSPITAL_COMMUNITY): Payer: 59

## 2015-01-10 DIAGNOSIS — I10 Essential (primary) hypertension: Secondary | ICD-10-CM | POA: Insufficient documentation

## 2015-01-10 DIAGNOSIS — M791 Myalgia: Secondary | ICD-10-CM | POA: Diagnosis present

## 2015-01-10 DIAGNOSIS — Z8639 Personal history of other endocrine, nutritional and metabolic disease: Secondary | ICD-10-CM | POA: Diagnosis not present

## 2015-01-10 DIAGNOSIS — Z3202 Encounter for pregnancy test, result negative: Secondary | ICD-10-CM | POA: Insufficient documentation

## 2015-01-10 DIAGNOSIS — M549 Dorsalgia, unspecified: Secondary | ICD-10-CM | POA: Insufficient documentation

## 2015-01-10 DIAGNOSIS — R000 Tachycardia, unspecified: Secondary | ICD-10-CM | POA: Diagnosis not present

## 2015-01-10 DIAGNOSIS — B349 Viral infection, unspecified: Secondary | ICD-10-CM

## 2015-01-10 LAB — URINALYSIS, ROUTINE W REFLEX MICROSCOPIC
Bilirubin Urine: NEGATIVE
Glucose, UA: NEGATIVE mg/dL
Hgb urine dipstick: NEGATIVE
Ketones, ur: NEGATIVE mg/dL
Leukocytes, UA: NEGATIVE
Nitrite: NEGATIVE
Protein, ur: NEGATIVE mg/dL
Specific Gravity, Urine: 1.027 (ref 1.005–1.030)
Urobilinogen, UA: 1 mg/dL (ref 0.0–1.0)
pH: 7 (ref 5.0–8.0)

## 2015-01-10 LAB — CBC WITH DIFFERENTIAL/PLATELET
Basophils Absolute: 0 10*3/uL (ref 0.0–0.1)
Basophils Relative: 0 % (ref 0–1)
Eosinophils Absolute: 0.1 10*3/uL (ref 0.0–0.7)
Eosinophils Relative: 2 % (ref 0–5)
HCT: 39.6 % (ref 36.0–46.0)
Hemoglobin: 13.2 g/dL (ref 12.0–15.0)
Lymphocytes Relative: 10 % — ABNORMAL LOW (ref 12–46)
Lymphs Abs: 0.6 10*3/uL — ABNORMAL LOW (ref 0.7–4.0)
MCH: 28.8 pg (ref 26.0–34.0)
MCHC: 33.3 g/dL (ref 30.0–36.0)
MCV: 86.5 fL (ref 78.0–100.0)
Monocytes Absolute: 0.5 10*3/uL (ref 0.1–1.0)
Monocytes Relative: 8 % (ref 3–12)
Neutro Abs: 5.2 10*3/uL (ref 1.7–7.7)
Neutrophils Relative %: 80 % — ABNORMAL HIGH (ref 43–77)
Platelets: 226 10*3/uL (ref 150–400)
RBC: 4.58 MIL/uL (ref 3.87–5.11)
RDW: 12.7 % (ref 11.5–15.5)
WBC: 6.5 10*3/uL (ref 4.0–10.5)

## 2015-01-10 LAB — COMPREHENSIVE METABOLIC PANEL
ALT: 12 U/L (ref 0–35)
AST: 20 U/L (ref 0–37)
Albumin: 3.9 g/dL (ref 3.5–5.2)
Alkaline Phosphatase: 75 U/L (ref 39–117)
Anion gap: 8 (ref 5–15)
BUN: 9 mg/dL (ref 6–23)
CO2: 24 mmol/L (ref 19–32)
Calcium: 8.9 mg/dL (ref 8.4–10.5)
Chloride: 104 mmol/L (ref 96–112)
Creatinine, Ser: 0.8 mg/dL (ref 0.50–1.10)
GFR calc Af Amer: >90 mL/min (ref >90)
GFR calc non Af Amer: >90 mL/min (ref >90)
Glucose, Bld: 89 mg/dL (ref 70–99)
Potassium: 3.7 mmol/L (ref 3.5–5.1)
Sodium: 136 mmol/L (ref 135–145)
Total Bilirubin: 0.4 mg/dL (ref 0.3–1.2)
Total Protein: 7.1 g/dL (ref 6.0–8.3)

## 2015-01-10 LAB — LIPASE, BLOOD: Lipase: 25 U/L (ref 11–59)

## 2015-01-10 LAB — I-STAT CG4 LACTIC ACID, ED: Lactic Acid, Venous: 1.12 mmol/L (ref 0.5–2.0)

## 2015-01-10 LAB — POC URINE PREG, ED: Preg Test, Ur: NEGATIVE

## 2015-01-10 MED ORDER — ACETAMINOPHEN 325 MG PO TABS
650.0000 mg | ORAL_TABLET | Freq: Once | ORAL | Status: AC
  Administered 2015-01-10: 650 mg via ORAL
  Filled 2015-01-10: qty 2

## 2015-01-10 MED ORDER — SODIUM CHLORIDE 0.9 % IV BOLUS (SEPSIS)
1000.0000 mL | Freq: Once | INTRAVENOUS | Status: AC
  Administered 2015-01-10: 1000 mL via INTRAVENOUS

## 2015-01-10 MED ORDER — ACETAMINOPHEN 325 MG PO TABS: 325.0000 mg | ORAL_TABLET | Freq: Four times a day (QID) | ORAL | Status: AC | PRN

## 2015-01-10 NOTE — ED Provider Notes (Signed)
CSN: [id number]     Arrival date & time 01/10/15  1231 History  This chart was scribed for non-physician practitioner, Jamse Mead, PA-C, working with Merryl Hacker, MD by Ladene Artist, ED Scribe. This patient was seen in room TR05C/TR05C and the patient's care was started at 2:54 PM . Chief Complaint  Patient presents with  . Generalized Body Aches   The history is provided by the patient. No language interpreter was used.   HPI Comments: [name] is a 29 year old female with past pedicle history of hypertension, high cholesterol presenting to the ED with body aches, fever, cold chills, fatigue, dry cough that started this morning. Patient reported that she was feeling just fine yesterday. Stated that she has generalized bodyaches all over feeling of achiness. Stated that she has some mild soreness in her abdomen, but denies exquisite pain. Reported that she's been able to keep fluids down-reported that she's been drinking tea that difficulty. Patient reported that she's been using Tylenol for relief. Patient reported that her mom and son as well as stepfather are sick with similar symptoms-reported that their symptoms started a couple of days ago. Patient reported that she was at work today, reported that she noticed some discomfort while working. Denied nausea, vomiting, diarrhea, nasal congestion, blurred vision, sudden loss of vision, neck pain, neck stiffness, dysuria, hematuria, melena, hematochezia, vaginal bleeding, vaginal discharge, vaginal pain, pelvic pain, ear pain, numbness, tingling, leg swelling, travel, chest pain, shortness of breath, difficulty breathing, headache, dizziness. PCP none Last menstrual cycle 12/31/2014  Past Medical History  Diagnosis Date  . Hypertension   . No pertinent past medical history   . High cholesterol    History reviewed. No pertinent past surgical history. Family History  Problem Relation Age of Onset  . Hypertension Mother   .  Diabetes Mother   . Cancer Mother   . Hypertension Father   . Diabetes Father    History  Substance Use Topics  . Smoking status: Never Smoker   . Smokeless tobacco: Never Used  . Alcohol Use: Yes     Comment: occasionally   OB History    Gravida Para Term Preterm AB TAB SAB Ectopic Multiple Living   2 2 1 1      3      Review of Systems  Constitutional: Positive for fever and chills. Negative for appetite change.  HENT: Negative for congestion, ear pain, sore throat and trouble swallowing.   Eyes: Negative for visual disturbance.  Respiratory: Positive for cough. Negative for shortness of breath.   Cardiovascular: Negative for chest pain and leg swelling.  Gastrointestinal: Positive for abdominal pain. Negative for nausea, vomiting, diarrhea and blood in stool.  Genitourinary: Negative for dysuria, hematuria, vaginal bleeding, vaginal discharge and vaginal pain.  Musculoskeletal: Positive for myalgias and back pain. Negative for neck pain and neck stiffness.  Skin: Negative for rash.   Allergies  Review of patient's allergies indicates no known allergies.  Home Medications   Prior to Admission medications   Medication Sig Start Date End Date Taking? Authorizing Provider  diphenhydrAMINE (SOMINEX) 25 MG tablet Take 25 mg by mouth at bedtime as needed for allergies or sleep.   Yes Historical Provider, MD  acetaminophen (TYLENOL) 325 MG tablet Take 1 tablet (325 mg total) by mouth every 6 (six) hours as needed. 01/10/15   Jerman Tinnon, PA-C   BP 144/94 mmHg  Pulse 106  Temp(Src) 100 F (37.8 C) (Oral)  Resp 20  Ht 5'  7" (1.702 m)  SpO2 98%  LMP 12/31/2014 Physical Exam  Constitutional: She is oriented to person, place, and time. She appears well-developed and well-nourished. No distress.  HENT:  Head: Normocephalic and atraumatic.  Right Ear: Hearing, tympanic membrane, external ear and ear canal normal. No mastoid tenderness.  Left Ear: Hearing, tympanic membrane,  external ear and ear canal normal. No mastoid tenderness.  Mouth/Throat: Oropharynx is clear and moist. No oropharyngeal exudate.  Negative trismus  Eyes: Conjunctivae and EOM are normal. Pupils are equal, round, and reactive to light. Right eye exhibits no discharge. Left eye exhibits no discharge.  Neck: Normal range of motion. Neck supple. No tracheal deviation present.  Negative neck stiffness Negative nuchal rigidity Negative cervical lymphadenopathy Negative meningeal signs  Cardiovascular: Regular rhythm and normal heart sounds.  Tachycardia present.   Pulses:      Radial pulses are 2+ on the right side, and 2+ on the left side.  Pulmonary/Chest: Effort normal and breath sounds normal. No respiratory distress. She has no wheezes. She has no rales.  Patient is able to speak in full sentences that difficulty Negative use of accessory muscles Negative stridor  Abdominal: Soft. Bowel sounds are normal. She exhibits no distension. There is no tenderness. There is no rebound and no guarding.  Musculoskeletal: Normal range of motion.  Lymphadenopathy:    She has no cervical adenopathy.  Neurological: She is alert and oriented to person, place, and time. GCS eye subscore is 4. GCS verbal subscore is 5. GCS motor subscore is 6.  Skin: Skin is warm and dry. No rash noted. She is not diaphoretic. No erythema.  Psychiatric: She has a normal mood and affect. Her behavior is normal. Thought content normal.  Nursing note and vitals reviewed.   ED Course  Procedures (including critical care time) DIAGNOSTIC STUDIES: Oxygen Saturation is 98% on RA, normal by my interpretation.    COORDINATION OF CARE: 3:00 PM-Discussed treatment plan which includes CXR, diagnostic lab work, Tylenol and IV fluids with pt at bedside and pt agreed to plan.   Results for orders placed or performed during the hospital encounter of 01/10/15  CBC with Differential/Platelet  Result Value Ref Range   WBC 6.5 4.0 -  10.5 K/uL   RBC 4.58 3.87 - 5.11 MIL/uL   Hemoglobin 13.2 12.0 - 15.0 g/dL   HCT 39.6 36.0 - 46.0 %   MCV 86.5 78.0 - 100.0 fL   MCH 28.8 26.0 - 34.0 pg   MCHC 33.3 30.0 - 36.0 g/dL   RDW 12.7 11.5 - 15.5 %   Platelets 226 150 - 400 K/uL   Neutrophils Relative % 80 (H) 43 - 77 %   Neutro Abs 5.2 1.7 - 7.7 K/uL   Lymphocytes Relative 10 (L) 12 - 46 %   Lymphs Abs 0.6 (L) 0.7 - 4.0 K/uL   Monocytes Relative 8 3 - 12 %   Monocytes Absolute 0.5 0.1 - 1.0 K/uL   Eosinophils Relative 2 0 - 5 %   Eosinophils Absolute 0.1 0.0 - 0.7 K/uL   Basophils Relative 0 0 - 1 %   Basophils Absolute 0.0 0.0 - 0.1 K/uL  Comprehensive metabolic panel  Result Value Ref Range   Sodium 136 135 - 145 mmol/L   Potassium 3.7 3.5 - 5.1 mmol/L   Chloride 104 96 - 112 mmol/L   CO2 24 19 - 32 mmol/L   Glucose, Bld 89 70 - 99 mg/dL   BUN 9 6 - 23  mg/dL   Creatinine, Ser 0.80 0.50 - 1.10 mg/dL   Calcium 8.9 8.4 - 10.5 mg/dL   Total Protein 7.1 6.0 - 8.3 g/dL   Albumin 3.9 3.5 - 5.2 g/dL   AST 20 0 - 37 U/L   ALT 12 0 - 35 U/L   Alkaline Phosphatase 75 39 - 117 U/L   Total Bilirubin 0.4 0.3 - 1.2 mg/dL   GFR calc non Af Amer >90 >90 mL/min   GFR calc Af Amer >90 >90 mL/min   Anion gap 8 5 - 15  Urinalysis, Routine w reflex microscopic  Result Value Ref Range   Color, Urine YELLOW YELLOW   APPearance CLEAR CLEAR   Specific Gravity, Urine 1.027 1.005 - 1.030   pH 7.0 5.0 - 8.0   Glucose, UA NEGATIVE NEGATIVE mg/dL   Hgb urine dipstick NEGATIVE NEGATIVE   Bilirubin Urine NEGATIVE NEGATIVE   Ketones, ur NEGATIVE NEGATIVE mg/dL   Protein, ur NEGATIVE NEGATIVE mg/dL   Urobilinogen, UA 1.0 0.0 - 1.0 mg/dL   Nitrite NEGATIVE NEGATIVE   Leukocytes, UA NEGATIVE NEGATIVE  Lipase, blood  Result Value Ref Range   Lipase 25 11 - 59 U/L  I-Stat CG4 Lactic Acid, ED  Result Value Ref Range   Lactic Acid, Venous 1.12 0.5 - 2.0 mmol/L  POC urine preg, ED (not at [facility])  Result Value Ref Range   Preg Test, Ur  NEGATIVE NEGATIVE   Dg Chest 2 View  01/10/2015   CLINICAL DATA:  Generalized body aches.  Fever and cough  EXAM: CHEST  2 VIEW  COMPARISON:  12/30/2010  FINDINGS: The heart size and mediastinal contours are within normal limits. Both lungs are clear. The visualized skeletal structures are unremarkable.  IMPRESSION: No active cardiopulmonary disease.   Electronically Signed   By: Franchot Gallo M.D.   On: 01/10/2015 15:51    Labs Review Labs Reviewed  CBC WITH DIFFERENTIAL/PLATELET - Abnormal; Notable for the following:    Neutrophils Relative % 80 (*)    Lymphocytes Relative 10 (*)    Lymphs Abs 0.6 (*)    All other components within normal limits  COMPREHENSIVE METABOLIC PANEL  URINALYSIS, ROUTINE W REFLEX MICROSCOPIC  LIPASE, BLOOD  I-STAT CG4 LACTIC ACID, ED  POC URINE PREG, ED   Imaging Review Dg Chest 2 View  01/10/2015   CLINICAL DATA:  Generalized body aches.  Fever and cough  EXAM: CHEST  2 VIEW  COMPARISON:  12/30/2010  FINDINGS: The heart size and mediastinal contours are within normal limits. Both lungs are clear. The visualized skeletal structures are unremarkable.  IMPRESSION: No active cardiopulmonary disease.   Electronically Signed   By: Franchot Gallo M.D.   On: 01/10/2015 15:51     EKG Interpretation None      MDM   Final diagnoses:  Viral syndrome    Medications  sodium chloride 0.9 % bolus 1,000 mL (0 mLs Intravenous Stopped 01/10/15 1739)  acetaminophen (TYLENOL) tablet 650 mg (650 mg Oral Given 01/10/15 1524)    Filed Vitals:   01/10/15 1236 01/10/15 1700  BP: 144/94 115/59  Pulse: 106 91  Temp: 100 F (37.8 C) 99.3 F (37.4 C)  TempSrc: Oral Oral  Resp: 20 16  Height: 5\' 7"  (1.702 m)   SpO2: 98% 100%   CBC negative elevated leukocytosis. Hemoglobin 13.2, hematocrit 39.6. CMP unremarkable. Lipase negative elevation. Lactic acid negative elevation. Urine pregnancy negative. Urinalysis negative for hemoglobin, nitrites, leukocytes-negative  findings of infection. Chest x-ray  no active cardiopulmonary disease noted. Patient started on IV fluids in ED setting. Tylenol administered. Patient tolerating food and fluids PO without episodes of emesis in the ED setting. Abdominal exam unremarkable-doubt acute abdominal processes. Negative findings of pneumonia. Doubt meningitis. Suspicion to be viral syndrome. Patient stable, afebrile. Patient not septic appearing. Discharged patient. [facility]. Discussed with patient to rest and stay hydrated. Discussed with patient to closely monitor symptoms and if symptoms are to worsen or change to report back to the ED - strict return instructions given.  Patient agreed to plan of care, understood, all questions answered.   Jamse Mead, PA-C 01/10/15 1743  Merryl Hacker, MD 01/11/15 1037

## 2015-01-10 NOTE — Discharge Instructions (Signed)
Please call your doctor for a followup appointment within 24-48 hours. When you talk to your doctor please let them know that you were seen in the emergency department and have them acquire all of your records so that they can discuss the findings with you and formulate a treatment plan to fully care for your new and ongoing problems. Please follow-up with health and wellness Center Please rest and stay hydrated Please avoid any physical strenuous activity Please drink plenty of water Please continue to monitor symptoms closely and if symptoms are to worsen or change (fever greater than 101, chills, sweating, nausea, vomiting, chest pain, shortness of breathe, difficulty breathing, weakness, numbness, tingling, worsening or changes to pain pattern, neck pain, neck stiffness, inability to keep food or fluids down, blurred vision, sudden loss of vision, fainting, dizziness, jaw pain) please report back to the Emergency Department immediately.   Viral Infections A virus is a type of germ. Viruses can cause:  Minor sore throats.  Aches and pains.  Headaches.  Runny nose.  Rashes.  Watery eyes.  Tiredness.  Coughs.  Loss of appetite.  Feeling sick to your stomach (nausea).  Throwing up (vomiting).  Watery poop (diarrhea). HOME CARE   Only take medicines as told by your doctor.  Drink enough water and fluids to keep your pee (urine) clear or pale yellow. Sports drinks are a good choice.  Get plenty of rest and eat healthy. Soups and broths with crackers or rice are fine. GET HELP RIGHT AWAY IF:   You have a very bad headache.  You have shortness of breath.  You have chest pain or neck pain.  You have an unusual rash.  You cannot stop throwing up.  You have watery poop that does not stop.  You cannot keep fluids down.  You or your child has a temperature by mouth above 102 F (38.9 C), not controlled by medicine.  Your baby is older than 3 months with a rectal  temperature of 102 F (38.9 C) or higher.  Your baby is 90 months old or younger with a rectal temperature of 100.4 F (38 C) or higher. MAKE SURE YOU:   Understand these instructions.  Will watch this condition.  Will get help right away if you are not doing well or get worse. Document Released: 09/30/2008 Document Revised: 01/10/2012 Document Reviewed: 02/23/2011 [facility] Patient Information 2015 [place], [place]. This information is not intended to replace advice given to you by your health care provider. Make sure you discuss any questions you have with your health care provider.   Emergency Department Resource Guide 1) Find a Doctor and Pay Out of Pocket Although you won't have to find out who is covered by your insurance plan, it is a good idea to ask around and get recommendations. You will then need to call the office and see if the doctor you have chosen will accept you as a new patient and what types of options they offer for patients who are self-pay. Some doctors offer discounts or will set up payment plans for their patients who do not have insurance, but you will need to ask so you aren't surprised when you get to your appointment.  2) Contact Your Local Health Department Not all health departments have doctors that can see patients for sick visits, but many do, so it is worth a call to see if yours does. If you don't know where your local health department is, you can check in your phone book.  The CDC also has a tool to help you locate your state's health department, and many state websites also have listings of all of their local health departments.  3) Find a [facility] If your illness is not likely to be very severe or complicated, you may want to try a walk in clinic. These are popping up all over the country in pharmacies, drugstores, and shopping centers. They're usually staffed by nurse practitioners or physician assistants that have been trained to treat common  illnesses and complaints. They're usually fairly quick and inexpensive. However, if you have serious medical issues or chronic medical problems, these are probably not your best option.  No Primary Care Doctor: - Call Health Connect at  [phone] - they can help you locate a primary care doctor that  accepts your insurance, provides certain services, etc. - Physician Referral Service- [phone]  Chronic Pain Problems: Organization         Address  Phone   Notes  [facility]  [phone] Patients need to be referred by their primary care doctor.   Medication Assistance: Organization         Address  Phone   Notes  [facility] Medication [facility] [address] [place], [place] [address] [phone] --Must be a resident of [facility] -- Must have NO insurance coverage whatsoever (no Medicaid/ Medicare, etc.) -- The pt. MUST have a primary care doctor that directs their care regularly and follows them in the community   [facility]ist  [phone]   [employer]  [phone]    Agencies that provide inexpensive medical care: Organization         Address  Phone   Notes  [place]  [phone]   [facility]    [phone]   [facility] [place], [place] [address] [phone]   [place] [address] [place] [phone]   Planned Parenthood    [phone]   [facility]    [phone]   [place] and [place] [address], [place] Phone:  [phone], Fax:  [phone] Hours of Operation:  9 am - 6 pm, M-F.  Also accepts Medicaid/Medicare and self-pay.  [facility] for [place] [address], [address], [place] Phone: [phone], Fax: [phone]. Hours of Operation:  8:30 am - 5:30 pm, M-F.  Also accepts Medicaid and self-pay.  [facility] [place]  [address], [place] Phone: [phone]   [place], [place], [place] [phone], Ext. [phone] Mondays & Thursdays: 7-9 AM.  First 15 patients are seen on a first come, first serve basis.    [place] Providers:  Organization         Address  Phone   Notes  [facility] [address], [place] [phone] Also accepts self-pay patients.  [facility] [address], [place]  [phone]   [place], [address], [place] [phone]   [facility] [address], [place] [phone]   Lucianne Lei [address], [place]   [phone] Only accepts [place] Access [place] patients after they have their name applied to their card.   Self-Pay (  no insurance) in [facility]:  Organization         Address  Phone   Notes  Sickle Cell Patients, [place] [phone]   [facility] Urgent Care [place] [phone]   [facility] [place]  [place], [place], [place] [phone]   [facility]/Dr. Osei-Bonsu  [address], [place] or [address], [place] [phone] Phone number for both [place] and [place] locations is the same.  Urgent Medical and [facility] [address], [place] [phone]   [facility] [address], [place] or [address] [phone]   [facility] [address] [place] [phone], phone; [phone], fax Sees patients 1st and 3rd Saturday of every month.  Must not qualify for public or private insurance (i.e. Medicaid, Medicare, [place] Health Choice, Veterans' Benefits)  Household income should be no more than 200% of the  poverty level The clinic cannot treat you if you are pregnant or think you are pregnant  Sexually transmitted diseases are not treated at the clinic.    Dental Care: Organization         Address  Phone  Notes  [facility] Department of [facility] [place] [phone] Accepts children up to age 54 who are enrolled in [place] or [place]; pregnant women with a Medicaid card; and children who have applied for Medicaid or [place] Health Choice, but were declined, whose parents can pay a reduced fee at time of service.  [facility] Department of [facility]  [address], [place] [phone] Accepts children up to age 25 who are enrolled in [place] or [place]; pregnant women with a Medicaid card; and children who have applied for Medicaid or [place] Health Choice, but were declined, whose parents can pay a reduced fee at time of service.  [place] Adult Dental Access PROGRAM  [place] [phone] Patients are seen by appointment only. Walk-ins are not accepted. [place] will see patients 63 years of age and older. Monday - Tuesday (8am-5pm) Most Wednesdays (8:30-5pm) $30 per visit, cash only  [facility] Adult Dental Access PROGRAM  [address], [facility] [phone] Patients are seen by appointment only. Walk-ins are not accepted. [place] will see patients 31 years of age and older. One Wednesday Evening (Monthly: Volunteer Based).  $30 per visit, cash only  [place]  [phone] for adults; Children under age 40, call Graduate Pediatric Dentistry at [phone]. Children aged 16-14, please call [phone] to request a pediatric application.  Dental services are provided in all areas of dental care including fillings, crowns and bridges, complete and partial dentures, implants, gum treatment, root canals, and extractions.  Preventive care is also provided. Treatment is provided to both adults and children. Patients are selected via a lottery and there is often a waiting list.   [facility] [address], [place]  [phone] [email or web address]   [facility] [address] [place], [place] [phone], Ext. [phone] Second and Fourth Thursday of each month, opens at 6:30 AM; Clinic ends at 9 AM.  Patients are seen on a first-come first-served basis, and a limited number are  seen during each clinic.   [facility]  [address] Hillard Danker [place], [place] [phone]   Eligibility Requirements You must have lived in [place], [place], or [place] counties for at least the last three months.   You cannot be eligible for state or federal sponsored [employer], including [employer], [place], or [employer].   You generally cannot be eligible for healthcare insurance through your employer.    How to apply: Eligibility screenings are held every Tuesday and Wednesday afternoon from 1:00 pm until 4:00 pm. You do not need an appointment for the interview!  [facility] [address], [place], [place]   [place]  [place] Department  [place]  [phone]    Behavioral Health Resources in the Community: Intensive Outpatient Programs Organization         Address  Phone  Notes  [place]. [address] [place], [place] [phone]   [facility] [address] [place], [place]   ADS: [facility] Drug [facility] [address] [place], [place]   [place] [address]  [place], [place] or [phone]   Substance Abuse Resources Organization         Address  Phone  Notes  [facility] Drug Services  [phone]   [place]  [phone]   [facility] [place]   Chinita Pester  [phone]   Residential & Outpatient Substance Abuse Program  [phone]   Psychological Services Organization         Address  Phone  Notes  [facility] [place]  [place]  [phone]   [address] [place] or [phone]    Mobile Crisis Teams Organization         Address  Phone  Notes  [facility], Mobile Crisis Care Unit  [phone]   [facility]  [address] [place], [place]   [place] Levels [address], [place] [phone]    Self-Help/Support Groups Organization         Address  Phone             Notes  [place]. of [place] - variety of support groups  [place] Call for more information  Narcotics Anonymous (NA), [facility] [address], [employer] [place]  2 meetings at this location   Special educational needs teacher         Address  Phone  Notes  [facility] [place],    [place]  [phone]   [facility]  [address], [place] [id number], [place], [place]   [place], [place] [phone] Admissions: 8am-3pm M-F  Incentives Substance [place] [address]    [place], [place] [phone]   [facility] [address] Jadene Pierini [place], [place]   [facility] [address]  [place], [place]   [facility] - Intensive Outpatient [address] Krist[address], [place], [place]   [facility] ([place].) [place].,  [place], [place] or [phone]   [facility] ([facility]) [address], [place], [place] Accepts Medicaid  [facility] [address].,  [place]  [phone] Substance Abuse/Addiction Treatment   [facility] Organization         Address  Phone  Notes  [facility]  [phone]   Domenic Schwab, PhD [address] Arlis [place], [place]   [phone] or [phone]   [place], [place] [phone]   [address], [place], [place] [phone] Insurance/Medicaid/sponsorship through [facility] and Families [address]                                    [place], [place] [phone] [place] [address], [place] [phone]    Dr. Adele Schilder  [phone]   [facility] Dept. 1) [address] [place] 2) [place] 3)  [address], [place] [phone]  [phone]   [place] [phone] or [phone] (After Hours)

## 2015-01-10 NOTE — ED Notes (Signed)
Pt reporting generalized body aches since this morning.  Sts her son and mom have recently been sick with the flu.  Reports chills, hot flashes, cough and body aches.

## 2015-11-14 ENCOUNTER — Encounter (HOSPITAL_COMMUNITY): Payer: Self-pay | Admitting: Emergency Medicine

## 2015-11-14 ENCOUNTER — Emergency Department (HOSPITAL_COMMUNITY)
Admission: EM | Admit: 2015-11-14 | Discharge: 2015-11-14 | Disposition: A | Discharge status: Home / Self Care | Payer: Worker's Compensation | Attending: Emergency Medicine | Admitting: Emergency Medicine

## 2015-11-14 DIAGNOSIS — I10 Essential (primary) hypertension: Secondary | ICD-10-CM | POA: Insufficient documentation

## 2015-11-14 DIAGNOSIS — Y9289 Other specified places as the place of occurrence of the external cause: Secondary | ICD-10-CM | POA: Insufficient documentation

## 2015-11-14 DIAGNOSIS — Y990 Civilian activity done for income or pay: Secondary | ICD-10-CM | POA: Diagnosis not present

## 2015-11-14 DIAGNOSIS — Z8639 Personal history of other endocrine, nutritional and metabolic disease: Secondary | ICD-10-CM | POA: Insufficient documentation

## 2015-11-14 DIAGNOSIS — T25121A Burn of first degree of right foot, initial encounter: Secondary | ICD-10-CM | POA: Diagnosis not present

## 2015-11-14 DIAGNOSIS — X118XXA Contact with other hot tap-water, initial encounter: Secondary | ICD-10-CM | POA: Insufficient documentation

## 2015-11-14 DIAGNOSIS — R200 Anesthesia of skin: Secondary | ICD-10-CM | POA: Insufficient documentation

## 2015-11-14 DIAGNOSIS — Y9389 Activity, other specified: Secondary | ICD-10-CM | POA: Diagnosis not present

## 2015-11-14 DIAGNOSIS — T25021A Burn of unspecified degree of right foot, initial encounter: Secondary | ICD-10-CM | POA: Diagnosis present

## 2015-11-14 NOTE — ED Notes (Signed)
Discharge delayed-pt is on phone filing workers compensation claim

## 2015-11-14 NOTE — Discharge Instructions (Signed)
Read the information below.  You may return to the Emergency Department at any time for worsening condition or any new symptoms that concern you.  If you develop increased redness, swelling, pus draining from the wound, uncontrolled pain, difficulty moving your toes, or fevers greater than 100.4, return to the ER immediately for a recheck.     Burn Care Your skin is a natural barrier to infection. It is the largest organ of your body. Burns damage this natural protection. To help prevent infection, it is very important to follow your caregiver's instructions in the care of your burn. Burns are classified as:  First degree. There is only redness of the skin (erythema). No scarring is expected.  Second degree. There is blistering of the skin. Scarring may occur with deeper burns.  Third degree. All layers of the skin are injured, and scarring is expected. HOME CARE INSTRUCTIONS   Wash your hands well before changing your bandage.  Change your bandage as often as directed by your caregiver.  Remove the old bandage. If the bandage sticks, you may soak it off with cool, clean water.  Cleanse the burn thoroughly but gently with mild soap and water.  Pat the area dry with a clean, dry cloth.  Apply a thin layer of antibacterial cream to the burn.  Apply a clean bandage as instructed by your caregiver.  Keep the bandage as clean and dry as possible.  Elevate the affected area for the first 24 hours, then as instructed by your caregiver.  Only take over-the-counter or prescription medicines for pain, discomfort, or fever as directed by your caregiver. SEEK IMMEDIATE MEDICAL CARE IF:   You develop excessive pain.  You develop redness, tenderness, swelling, or red streaks near the burn.  The burned area develops yellowish-white fluid (pus) or a bad smell.  You have a fever. MAKE SURE YOU:   Understand these instructions.  Will watch your condition.  Will get help right away if  you are not doing well or get worse.   This information is not intended to replace advice given to you by your health care provider. Make sure you discuss any questions you have with your health care provider.   Document Released: 10/18/2005 Document Revised: 01/10/2012 Document Reviewed: 03/10/2011 Elsevier Interactive Patient Education 2016 [employer].    Emergency Department Resource Guide 1) Find a Doctor and Pay Out of Pocket Although you won't have to find out who is covered by your insurance plan, it is a good idea to ask around and get recommendations. You will then need to call the office and see if the doctor you have chosen will accept you as a new patient and what types of options they offer for patients who are self-pay. Some doctors offer discounts or will set up payment plans for their patients who do not have insurance, but you will need to ask so you aren't surprised when you get to your appointment.  2) Contact Your Local Health Department Not all health departments have doctors that can see patients for sick visits, but many do, so it is worth a call to see if yours does. If you don't know where your local health department is, you can check in your phone book. The CDC also has a tool to help you locate your state's health department, and many state websites also have listings of all of their local health departments.  3) Find a [facility] If your illness is not likely to be very  severe or complicated, you may want to try a walk in clinic. These are popping up all over the country in pharmacies, drugstores, and shopping centers. They're usually staffed by nurse practitioners or physician assistants that have been trained to treat common illnesses and complaints. They're usually fairly quick and inexpensive. However, if you have serious medical issues or chronic medical problems, these are probably not your best option.  No Primary Care Doctor: - Call Health Connect at   [phone] - they can help you locate a primary care doctor that  accepts your insurance, provides certain services, etc. - Physician Referral Service- [phone]  Chronic Pain Problems: Organization         Address  Phone   Notes  [facility]  [phone] Patients need to be referred by their primary care doctor.   Medication Assistance: Organization         Address  Phone   Notes  [facility] Medication [facility] [place] [place], [place] [address] [phone] --Must be a resident of [facility] -- Must have NO insurance coverage whatsoever (no Medicaid/ Medicare, etc.) -- The pt. MUST have a primary care doctor that directs their care regularly and follows them in the community   MedAssist  [phone]   [employer]  [phone]    Agencies that provide inexpensive medical care: Organization         Address  Phone   Notes  [place]  [phone]   [facility]    [phone]   [facility] [place], [place] [address] [phone]   [place] [address] [place] [phone]   Planned Parenthood    [phone]   [facility]    [phone]   [facility] and [place] [address], [place] Phone:  [phone], Fax:  [phone] Hours of Operation:  9 am - 6 pm, M-F.  Also accepts Medicaid/Medicare and self-pay.  [facility] [place], [address], [place] Phone: [phone], Fax: [phone]. Hours of Operation:  8:30 am - 5:30 pm, M-F.  Also accepts Medicaid and self-pay.  [facility] [place] [address], [place] Phone: [phone]   [place], [place], [place] [phone], Ext. [phone] Mondays & Thursdays: 7-9 AM.  First 15 patients are seen on a first come, first serve basis.     [place]rian Providers:  Organization         Address  Phone   Notes  [facility] [address],  [phone] Also accepts self-pay patients.  [facility] [address], [place]  [phone]   [place], [address], [place] [phone]   [facility] [address], [place] [phone]   Lucianne Lei [address], [place]   [phone] Only accepts [place] Access [place] patients after they have their name applied to their card.   Self-Pay (no insurance) in [facility]:  Organization         Address  Phone   Notes  Sickle Cell Patients, [facility] [place] [phone]   [facility] [phone]  [place] [phone]   [facility] [place]  [address] [address], [place] [phone]   [facility]/Dr. Osei-Bonsu  [address], [place] [phone] Phone number for both [place] and [place] locations is the same.  Urgent [facility] and [facility] [address] [place] [phone]   [facility] [address] [place] or [address] [phone]   [facility] [address], [place] [phone], phone; [phone], fax Sees patients 1st and 3rd Saturday of every month.  Must not qualify for public or private insurance (i.e. Medicaid, Medicare, [place] Health Choice, Veterans' Benefits)  Household income should be no more than 200% of the poverty level The clinic cannot treat you if you are pregnant or think you are pregnant  Sexually transmitted diseases are not treated at the clinic.    Dental Care: Organization         Address  Phone  Notes  [facility]  Department of [facility] [place] [phone] Accepts children up to age 54 who are enrolled in [place] or [place]; pregnant women with a Medicaid card; and children who have applied for Medicaid or [place] Health Choice, but were declined, whose parents can pay a reduced fee at time of service.  [facility] Department of [facility]  [address], [place] [phone] Accepts children up to age 81 who are enrolled in [place] or [place]; pregnant women with a Medicaid card; and children who have applied for Medicaid or [place] Health Choice, but were declined, whose parents can pay a reduced fee at time of service.  [place] Adult Dental Access PROGRAM  [place] [phone] Patients are seen by appointment only. Walk-ins are not accepted. [place] will see patients 30 years of age and older. Monday - Tuesday (8am-5pm) Most Wednesdays (8:30-5pm) $30 per visit, cash only  [facility] Adult Dental Access PROGRAM  [address], [facility] [phone] Patients are seen by appointment only. Walk-ins are not accepted. [place] will see patients 13 years of age and older. One Wednesday Evening (Monthly: Volunteer Based).  $30 per visit, cash only  [place]  [phone] for adults; Children under age 45, call Graduate Pediatric Dentistry at [phone]. Children aged 43-14, please call [phone] to request a pediatric application.  Dental services are provided in all areas of dental care including fillings, crowns and bridges, complete and partial dentures, implants, gum treatment, root canals, and extractions. Preventive care is also provided. Treatment is provided to both adults and children. Patients are selected via a lottery and there is often a waiting list.   [facility] [address] [place]  [phone]  [email or web address]   [facility] [address] [place], [place] [phone], Ext. [phone] Second and Fourth Thursday of each month, opens at 6:30 AM; Clinic ends at 9 AM.  Patients are seen on a first-come first-served basis, and a limited number are seen during each clinic.   [facility]  [address] Hillard Danker [place], [place] [phone]   Eligibility Requirements You must have lived in [place], [place], or [place] counties for at least the last three months.   You  cannot be eligible for state or federal sponsored [employer], including [employer], [place], or [employer].   You generally cannot be eligible for healthcare insurance through your employer.    How to apply: Eligibility screenings are held every Tuesday and Wednesday afternoon from 1:00 pm until 4:00 pm. You do not need an appointment for the interview!  [facility] [address] [place], [place]   [place]  [place] Department  [place]  [phone]    Behavioral Health Resources in the Community: Intensive Outpatient Programs Organization         Address  Phone  Notes  [facility] [address], [place], [place] [phone]   [facility] Outpatient [address], [place], [place]   ADS: [facility] [address] [place], [place]   [place] [address]  [place], [place] or [phone]   Substance Abuse Resources Organization         Address  Phone  Notes  Al[facility] Drug Services  [phone]   [place]  [phone]   [facility]   Chinita Pester  [phone]   Residential & Outpatient Substance Abuse Program  [phone]   Psychological Services Organization          Address  Phone  Notes  [facility] [place]  [place]  [phone]   [place] [address], [place] or [phone]    Mobile Crisis Teams Organization         Address  Phone  Notes  [facility], Mobile Crisis Care Unit  [phone]   [facility]  [address] [place], [place]   [place] [address] [place] [phone]    Self-Help/Support Groups Organization         Address  Phone             Notes  [place]. of [place] - variety of support groups  Anna Call for more information  Narcotics Anonymous (NA), [facility] [address], [employer] [place]  2 meetings at this location   Special educational needs teacher         Address  Phone  Notes  [facility] Residential Treatment [place],    [place]  [phone]   [facility]  [address] [place] [address], [place], [place]   Britt Worton, [place] [phone] Admissions: 8am-3pm M-F  Incentives Substance [place] [address]    [place], [place] [phone]   [facility] [address] [place], [place], [place]   [facility] [address]  [place], [place]   [facility] - Intensive Outpatient [address], [place], [place]   [facility] ([place] [place].,  [place], [place] [phone] or [phone]   [facility] Services (RTS) [address] [place], [place] Accepts Medicaid  [facility] [place] [address],  [place] [phone] Substance Abuse/Addiction Treatment   [facility] Organization         Address  Phone  Notes  [facility]  [phone]   Domenic Schwab, PhD [place],  Braulio Bosch, [place]   [phone] or [phone]   [facility]   [address] [place], [place] [phone]   [place] [address], [place], [place] [phone] Insurance/Medicaid/sponsorship through [facility] [address]                                    [place], [place] [phone] [place], [place] [phone]    Dr. Adele Schilder  [phone]   [facility] Dept. 1) [address] [place] 2) [place] 3)  [place] [address], [place] [phone]  [phone]   [place] [phone] or [phone] (After Hours)

## 2015-11-14 NOTE — ED Provider Notes (Signed)
CSN: [id number]     Arrival date & time 11/14/15  1024 History   First MD Initiated Contact with Patient 11/14/15 1125     Chief Complaint  Patient presents with  . Foot Burn     (Consider location/radiation/quality/duration/timing/severity/associated sxs/prior Treatment) The history is provided by the patient.     Patient presents with right foot injury from hot water and hot oven cleaner that she accidentally spilled during a cleaning cycle of the oven at work.  She was wearing cotton socks and crocks when the accident occurred.  The patient took her shoes/socks off and washed the area in cool water, applied burn cream and guaze.  Reports tingling/burning sensation and coolness of the right foot, worse with palpation. Denies other injury.   Past Medical History  Diagnosis Date  . Hypertension   . No pertinent past medical history   . High cholesterol    History reviewed. No pertinent past surgical history. Family History  Problem Relation Age of Onset  . Hypertension Mother   . Diabetes Mother   . Cancer Mother   . Hypertension Father   . Diabetes Father    Social History  Substance Use Topics  . Smoking status: Never Smoker   . Smokeless tobacco: Never Used  . Alcohol Use: Yes     Comment: occasionally   OB History    Gravida Para Term Preterm AB TAB SAB Ectopic Multiple Living   2 2 1 1      3      Review of Systems  Constitutional: Negative for fever and chills.  Cardiovascular: Negative for leg swelling.  Musculoskeletal: Negative for joint swelling and arthralgias.  Skin: Positive for color change. Negative for rash and wound.  Allergic/Immunologic: Negative for immunocompromised state.  Neurological: Positive for numbness. Negative for weakness.  Hematological: Does not bruise/bleed easily.  Psychiatric/Behavioral: Negative for self-injury (accidental).      Allergies  Review of patient's allergies indicates no known allergies.  Home Medications    Prior to Admission medications   Medication Sig Start Date End Date Taking? Authorizing Provider  acetaminophen (TYLENOL) 325 MG tablet Take 1 tablet (325 mg total) by mouth every 6 (six) hours as needed. 01/10/15   Marissa Sciacca, PA-C  diphenhydrAMINE (SOMINEX) 25 MG tablet Take 25 mg by mouth at bedtime as needed for allergies or sleep.    Historical Provider, MD   BP 138/98 mmHg  Pulse 82  Temp(Src) 98.3 F (36.8 C) (Oral)  Resp 18  SpO2 100%  LMP 11/02/2015 Physical Exam  Constitutional: She appears well-developed and well-nourished. No distress.  HENT:  Head: Normocephalic and atraumatic.  Neck: Neck supple.  Pulmonary/Chest: Effort normal.  Musculoskeletal:  Dorsal right foot with hypersensitivity to light palpation mild erythema.  No break in skin, no blistering.  Moves all toes.  Sensation intact.  Right foot slightly cooler than left.  Capillary refill < 3 seconds throughout.    Neurological: She is alert.  Skin: She is not diaphoretic.  Nursing note and vitals reviewed.   ED Course  Procedures (including critical care time) Labs Review Labs Reviewed - No data to display  Imaging Review No results found. I have personally reviewed and evaluated these images and lab results as part of my medical decision-making.   EKG Interpretation None       Discussed pt with Dr Regenia Skeeter.   I spoke with [place] regarding this patient's injury.    MDM   Final diagnoses:  Burn of right foot, first degree, initial encounter    Afebrile, nontoxic patient with right foot burn that occurred at work.  Dorsal right foot with superficial burn, thermal burn is first degree.  Chemical burn (oven cleaner) did not break skin, washed well. Slight temperature change likely due to cooling burn cream used at work.  Pt unaware of any of the chemical names that hurt her or that she used for treatment.   D/C home with return precautions, wound care instructions.    Discussed result, findings, treatment, and follow up  with patient.  Pt given return precautions.  Pt verbalizes understanding and agrees with plan.         Clayton Bibles, PA-C 11/14/15 1531  Sherwood Gambler, MD 11/15/15 0700

## 2015-11-14 NOTE — ED Notes (Signed)
Per pt, states hot water from cleaner burned her right foot

## 2016-07-23 ENCOUNTER — Encounter (HOSPITAL_COMMUNITY): Payer: Self-pay | Admitting: *Deleted

## 2016-07-23 ENCOUNTER — Emergency Department (HOSPITAL_COMMUNITY)
Admission: EM | Admit: 2016-07-23 | Discharge: 2016-07-24 | Disposition: A | Discharge status: Home / Self Care | Payer: PRIVATE HEALTH INSURANCE | Attending: Emergency Medicine | Admitting: Emergency Medicine

## 2016-07-23 ENCOUNTER — Emergency Department (HOSPITAL_COMMUNITY): Payer: PRIVATE HEALTH INSURANCE

## 2016-07-23 DIAGNOSIS — R079 Chest pain, unspecified: Secondary | ICD-10-CM | POA: Diagnosis present

## 2016-07-23 DIAGNOSIS — R0789 Other chest pain: Secondary | ICD-10-CM | POA: Diagnosis not present

## 2016-07-23 DIAGNOSIS — Z79899 Other long term (current) drug therapy: Secondary | ICD-10-CM | POA: Insufficient documentation

## 2016-07-23 DIAGNOSIS — I10 Essential (primary) hypertension: Secondary | ICD-10-CM | POA: Insufficient documentation

## 2016-07-23 LAB — BASIC METABOLIC PANEL
Anion gap: 6 (ref 5–15)
BUN: 8 mg/dL (ref 6–20)
CO2: 24 mmol/L (ref 22–32)
Calcium: 9.5 mg/dL (ref 8.9–10.3)
Chloride: 110 mmol/L (ref 101–111)
Creatinine, Ser: 0.77 mg/dL (ref 0.44–1.00)
GFR calc Af Amer: >60 mL/min (ref >60)
GFR calc non Af Amer: >60 mL/min (ref >60)
Glucose, Bld: 114 mg/dL — ABNORMAL HIGH (ref 65–99)
Potassium: 3.9 mmol/L (ref 3.5–5.1)
Sodium: 140 mmol/L (ref 135–145)

## 2016-07-23 LAB — CBC
HCT: 38.2 % (ref 36.0–46.0)
Hemoglobin: 12.2 g/dL (ref 12.0–15.0)
MCH: 28.6 pg (ref 26.0–34.0)
MCHC: 31.9 g/dL (ref 30.0–36.0)
MCV: 89.5 fL (ref 78.0–100.0)
Platelets: 262 10*3/uL (ref 150–400)
RBC: 4.27 MIL/uL (ref 3.87–5.11)
RDW: 12.9 % (ref 11.5–15.5)
WBC: 10.3 10*3/uL (ref 4.0–10.5)

## 2016-07-23 LAB — TROPONIN I: Troponin I: <0.03 ng/mL (ref <0.03)

## 2016-07-23 NOTE — ED Triage Notes (Signed)
The pt is c/o chest pain for one month and her bp has been elevated.  She is supposed to take bp meds but she has not taken any in years  lmp sept 14th

## 2016-07-23 NOTE — ED Notes (Signed)
Called twice in lobby, no answer

## 2016-07-24 MED ORDER — IBUPROFEN 200 MG PO TABS
600.0000 mg | ORAL_TABLET | Freq: Once | ORAL | Status: AC
  Administered 2016-07-24: 600 mg via ORAL
  Filled 2016-07-24: qty 3

## 2016-07-24 MED ORDER — NAPROXEN 500 MG PO TABS: 500.0000 mg | ORAL_TABLET | Freq: Two times a day (BID) | ORAL | 0 refills | Status: DC

## 2016-07-24 MED ORDER — LISINOPRIL 20 MG PO TABS: 20.0000 mg | ORAL_TABLET | Freq: Every day | ORAL | 0 refills | Status: DC

## 2016-07-24 NOTE — ED Triage Notes (Signed)
The pt just woke up in waiting room.  She still wants to be seen

## 2016-07-24 NOTE — ED Provider Notes (Signed)
[facility] DEPT Provider Note   CSN: [id number] Arrival date & time: 07/23/16  2059  By signing my name below, I, Irene Pap, attest that this documentation has been prepared under the direction and in the presence of Merryl Hacker, MD. Electronically Signed: Irene Pap, ED Scribe. 07/24/16. 12:34 AM.  History   Chief Complaint Chief Complaint  Patient presents with  . Chest Pain   The history is provided by the patient. No language interpreter was used.  HPI Comments: [name] is a 30 y.o. female who presents to the Emergency Department complaining of ongoing, intermittent chest pain for the past month, worsening tonight. She says that the pain feels like contractions. She reports relief with deep breathing. Pt says she went to a clinic to be tested for STDs when she discovered that her BP was elevated. She has previously been on HTN medications in the past, but says she has not taken any in years. She has not taken anything for her symptoms. She reports doing heavy lifting at work. She denies recent long travel, hx of blood clots, use of BC or hormone therapy, fever, chills, nausea, vomiting, leg swelling, or SOB.  LMP sept 14.  Past Medical History:  Diagnosis Date  . High cholesterol   . Hypertension   . No pertinent past medical history     There are no active problems to display for this patient.   History reviewed. No pertinent surgical history.  OB History    Gravida Para Term Preterm AB Living   2 2 1 1   3    SAB TAB Ectopic Multiple Live Births           2      Home Medications    Prior to Admission medications   Medication Sig Start Date End Date Taking? Authorizing Provider  acetaminophen (TYLENOL) 325 MG tablet Take 1 tablet (325 mg total) by mouth every 6 (six) hours as needed. 01/10/15   Marissa Sciacca, PA-C  diphenhydrAMINE (SOMINEX) 25 MG tablet Take 25 mg by mouth at bedtime as needed for allergies or sleep.    Historical  Provider, MD  lisinopril (PRINIVIL,ZESTRIL) 20 MG tablet Take 1 tablet (20 mg total) by mouth daily. 07/24/16   Merryl Hacker, MD  naproxen (NAPROSYN) 500 MG tablet Take 1 tablet (500 mg total) by mouth 2 (two) times daily. 07/24/16   Merryl Hacker, MD    Family History Family History  Problem Relation Age of Onset  . Hypertension Mother   . Diabetes Mother   . Cancer Mother   . Hypertension Father   . Diabetes Father     Social History Social History  Substance Use Topics  . Smoking status: Never Smoker  . Smokeless tobacco: Never Used  . Alcohol use Yes     Comment: occasionally     Allergies   Review of patient's allergies indicates no known allergies.   Review of Systems Review of Systems  Constitutional: Negative for chills and fever.  Respiratory: Negative for shortness of breath.   Cardiovascular: Positive for chest pain. Negative for leg swelling.  Gastrointestinal: Negative for nausea and vomiting.  All other systems reviewed and are negative.   Physical Exam Updated Vital Signs BP (!) 138/101   Pulse 74   Temp 97.7 F (36.5 C) (Oral)   Resp 16   LMP 07/15/2016   SpO2 99%   Physical Exam  Constitutional: She is oriented to person, place, and time. She appears  well-developed and well-nourished. No distress.  HENT:  Head: Normocephalic and atraumatic.  Neck: Neck supple.  Cardiovascular: Normal rate, regular rhythm and normal heart sounds.   No murmur heard. Pulmonary/Chest: Effort normal. No respiratory distress. She has no wheezes. She exhibits tenderness.  Abdominal: Soft. There is no tenderness.  Musculoskeletal: She exhibits no edema.  Neurological: She is alert and oriented to person, place, and time.  Skin: Skin is warm and dry.  Psychiatric: She has a normal mood and affect.  Nursing note and vitals reviewed.    ED Treatments / Results  DIAGNOSTIC STUDIES: Oxygen Saturation is 99% on RA, normal by my interpretation.     COORDINATION OF CARE: 12:34 AM-Discussed treatment plan which includes labs, EKG, and x-ray with pt at bedside and pt agreed to plan.    Labs (all labs ordered are listed, but only abnormal results are displayed) Labs Reviewed  BASIC METABOLIC PANEL - Abnormal; Notable for the following:       Result Value   Glucose, Bld 114 (*)    All other components within normal limits  CBC  TROPONIN I    EKG  EKG Interpretation  Date/Time:  Friday July 23 2016 21:03:59 EDT Ventricular Rate:  85 PR Interval:  166 QRS Duration: 92 QT Interval:  366 QTC Calculation: 435 R Axis:   56 Text Interpretation:  Normal sinus rhythm Normal ECG Confirmed by Kinley Dozier  MD, Roshawnda Pecora ([id number]) on 07/23/2016 11:05:45 PM       Radiology Dg Chest 2 View  Result Date: 07/23/2016 CLINICAL DATA:  Chest pain for a month.  History of hypertension. EXAM: CHEST  2 VIEW COMPARISON:  Chest radiograph January 10, 2015 FINDINGS: Cardiomediastinal silhouette is normal. No pleural effusions or focal consolidations. Trachea projects midline and there is no pneumothorax. Soft tissue planes and included osseous structures are non-suspicious. IMPRESSION: Normal chest. Electronically Signed   By: Elon Alas M.D.   On: 07/23/2016 22:22    Procedures Procedures (including critical care time)  Medications Ordered in ED Medications  ibuprofen (ADVIL,MOTRIN) tablet 600 mg (not administered)     Initial Impression / Assessment and Plan / ED Course  I have reviewed the triage vital signs and the nursing notes.  Pertinent labs & imaging results that were available during my care of the patient were reviewed by me and considered in my medical decision making (see chart for details).  Clinical Course    Patient presents with chest pain. Intermittent for one month. Reproducible on exam. She is nontoxic. Normal EKG. Negative troponin. She does have blood pressure of 138/101. Reports history of hypertension and  previously on medications. Not currently taking anything. Doubt ACS. She is PERC neg.  discussed with the patient reinitiating BP meds. Will start lisinopril. Anti-inflammatories for chest wall pain and [facility] for Follow-Up.  After history, exam, and medical workup I feel the patient has been appropriately medically screened and is safe for discharge home. Pertinent diagnoses were discussed with the patient. Patient was given return precautions.   Final Clinical Impressions(s) / ED Diagnoses   Final diagnoses:  Chest wall pain  Essential hypertension   I personally performed the services described in this documentation, which was scribed in my presence. The recorded information has been reviewed and is accurate.   New Prescriptions New Prescriptions   LISINOPRIL (PRINIVIL,ZESTRIL) 20 MG TABLET    Take 1 tablet (20 mg total) by mouth daily.   NAPROXEN (NAPROSYN) 500 MG TABLET    Take 1  tablet (500 mg total) by mouth 2 (two) times daily.     Merryl Hacker, MD 07/24/16 [phone]

## 2016-12-15 DIAGNOSIS — Z0000 Encounter for general adult medical examination without abnormal findings: Secondary | ICD-10-CM | POA: Diagnosis not present

## 2016-12-15 DIAGNOSIS — Z13220 Encounter for screening for lipoid disorders: Secondary | ICD-10-CM | POA: Diagnosis not present

## 2016-12-21 DIAGNOSIS — I10 Essential (primary) hypertension: Secondary | ICD-10-CM | POA: Diagnosis not present

## 2016-12-21 DIAGNOSIS — Z0000 Encounter for general adult medical examination without abnormal findings: Secondary | ICD-10-CM | POA: Diagnosis not present

## 2017-01-17 DIAGNOSIS — Z130 Encounter for screening for diseases of the blood and blood-forming organs and certain disorders involving the immune mechanism: Secondary | ICD-10-CM | POA: Diagnosis not present

## 2017-01-17 DIAGNOSIS — Z01419 Encounter for gynecological examination (general) (routine) without abnormal findings: Secondary | ICD-10-CM | POA: Diagnosis not present

## 2017-01-17 DIAGNOSIS — D649 Anemia, unspecified: Secondary | ICD-10-CM | POA: Diagnosis not present

## 2017-01-17 DIAGNOSIS — Z113 Encounter for screening for infections with a predominantly sexual mode of transmission: Secondary | ICD-10-CM | POA: Diagnosis not present

## 2017-01-17 DIAGNOSIS — R875 Abnormal microbiological findings in specimens from female genital organs: Secondary | ICD-10-CM | POA: Diagnosis not present

## 2017-01-17 DIAGNOSIS — N390 Urinary tract infection, site not specified: Secondary | ICD-10-CM | POA: Diagnosis not present

## 2017-01-17 DIAGNOSIS — N898 Other specified noninflammatory disorders of vagina: Secondary | ICD-10-CM | POA: Diagnosis not present

## 2017-01-17 DIAGNOSIS — Z1389 Encounter for screening for other disorder: Secondary | ICD-10-CM | POA: Diagnosis not present

## 2017-01-17 DIAGNOSIS — Z124 Encounter for screening for malignant neoplasm of cervix: Secondary | ICD-10-CM | POA: Diagnosis not present

## 2017-01-18 DIAGNOSIS — I10 Essential (primary) hypertension: Secondary | ICD-10-CM | POA: Diagnosis not present

## 2017-02-21 DIAGNOSIS — A5900 Urogenital trichomoniasis, unspecified: Secondary | ICD-10-CM | POA: Diagnosis not present

## 2018-01-18 DIAGNOSIS — Z130 Encounter for screening for diseases of the blood and blood-forming organs and certain disorders involving the immune mechanism: Secondary | ICD-10-CM | POA: Diagnosis not present

## 2018-01-18 DIAGNOSIS — Z01419 Encounter for gynecological examination (general) (routine) without abnormal findings: Secondary | ICD-10-CM | POA: Diagnosis not present

## 2018-01-18 DIAGNOSIS — Z1389 Encounter for screening for other disorder: Secondary | ICD-10-CM | POA: Diagnosis not present

## 2018-01-18 DIAGNOSIS — N3941 Urge incontinence: Secondary | ICD-10-CM | POA: Diagnosis not present

## 2018-01-18 DIAGNOSIS — N898 Other specified noninflammatory disorders of vagina: Secondary | ICD-10-CM | POA: Diagnosis not present

## 2018-02-15 DIAGNOSIS — N926 Irregular menstruation, unspecified: Secondary | ICD-10-CM | POA: Diagnosis not present

## 2018-02-15 DIAGNOSIS — J302 Other seasonal allergic rhinitis: Secondary | ICD-10-CM | POA: Diagnosis not present

## 2018-02-15 DIAGNOSIS — I10 Essential (primary) hypertension: Secondary | ICD-10-CM | POA: Diagnosis not present

## 2018-02-15 DIAGNOSIS — R21 Rash and other nonspecific skin eruption: Secondary | ICD-10-CM | POA: Diagnosis not present

## 2018-02-21 ENCOUNTER — Other Ambulatory Visit: Payer: Self-pay

## 2018-02-21 ENCOUNTER — Encounter (HOSPITAL_COMMUNITY): Payer: Self-pay | Admitting: Emergency Medicine

## 2018-02-21 DIAGNOSIS — M545 Low back pain: Secondary | ICD-10-CM | POA: Insufficient documentation

## 2018-02-21 DIAGNOSIS — O26891 Other specified pregnancy related conditions, first trimester: Secondary | ICD-10-CM | POA: Insufficient documentation

## 2018-02-21 DIAGNOSIS — I10 Essential (primary) hypertension: Secondary | ICD-10-CM | POA: Insufficient documentation

## 2018-02-21 DIAGNOSIS — R1030 Lower abdominal pain, unspecified: Secondary | ICD-10-CM | POA: Insufficient documentation

## 2018-02-21 DIAGNOSIS — Z79899 Other long term (current) drug therapy: Secondary | ICD-10-CM | POA: Diagnosis not present

## 2018-02-21 DIAGNOSIS — Z3A00 Weeks of gestation of pregnancy not specified: Secondary | ICD-10-CM | POA: Diagnosis not present

## 2018-02-21 DIAGNOSIS — Z3A01 Less than 8 weeks gestation of pregnancy: Secondary | ICD-10-CM | POA: Insufficient documentation

## 2018-02-21 DIAGNOSIS — R109 Unspecified abdominal pain: Secondary | ICD-10-CM | POA: Diagnosis not present

## 2018-02-21 LAB — URINALYSIS, ROUTINE W REFLEX MICROSCOPIC
Bacteria, UA: NONE SEEN
Bilirubin Urine: NEGATIVE
Glucose, UA: NEGATIVE mg/dL
Hgb urine dipstick: NEGATIVE
Ketones, ur: NEGATIVE mg/dL
Nitrite: NEGATIVE
Protein, ur: NEGATIVE mg/dL
Specific Gravity, Urine: 1.024 (ref 1.005–1.030)
pH: 6 (ref 5.0–8.0)

## 2018-02-21 LAB — POC URINE PREG, ED: Preg Test, Ur: POSITIVE — AB

## 2018-02-21 NOTE — ED Triage Notes (Signed)
Pt is c/o lower abd cramping and low back pain  Pt states she is late on her period

## 2018-02-22 ENCOUNTER — Emergency Department (HOSPITAL_COMMUNITY): Payer: BLUE CROSS/BLUE SHIELD

## 2018-02-22 ENCOUNTER — Emergency Department (HOSPITAL_COMMUNITY)
Admission: EM | Admit: 2018-02-22 | Discharge: 2018-02-22 | Disposition: A | Discharge status: Home / Self Care | Payer: BLUE CROSS/BLUE SHIELD | Attending: Emergency Medicine | Admitting: Emergency Medicine

## 2018-02-22 DIAGNOSIS — R1030 Lower abdominal pain, unspecified: Secondary | ICD-10-CM

## 2018-02-22 DIAGNOSIS — Z3A01 Less than 8 weeks gestation of pregnancy: Secondary | ICD-10-CM

## 2018-02-22 DIAGNOSIS — Z3A00 Weeks of gestation of pregnancy not specified: Secondary | ICD-10-CM | POA: Diagnosis not present

## 2018-02-22 DIAGNOSIS — O26891 Other specified pregnancy related conditions, first trimester: Secondary | ICD-10-CM | POA: Diagnosis not present

## 2018-02-22 LAB — CBC WITH DIFFERENTIAL/PLATELET
Basophils Absolute: 0.1 10*3/uL (ref 0.0–0.1)
Basophils Relative: 0 %
Eosinophils Absolute: 0.4 10*3/uL (ref 0.0–0.7)
Eosinophils Relative: 4 %
HCT: 37.2 % (ref 36.0–46.0)
Hemoglobin: 12.3 g/dL (ref 12.0–15.0)
Lymphocytes Relative: 32 %
Lymphs Abs: 3.6 10*3/uL (ref 0.7–4.0)
MCH: 28.9 pg (ref 26.0–34.0)
MCHC: 33.1 g/dL (ref 30.0–36.0)
MCV: 87.3 fL (ref 78.0–100.0)
Monocytes Absolute: 0.6 10*3/uL (ref 0.1–1.0)
Monocytes Relative: 5 %
Neutro Abs: 6.6 10*3/uL (ref 1.7–7.7)
Neutrophils Relative %: 59 %
Platelets: 282 10*3/uL (ref 150–400)
RBC: 4.26 MIL/uL (ref 3.87–5.11)
RDW: 12.9 % (ref 11.5–15.5)
WBC: 11.3 10*3/uL — ABNORMAL HIGH (ref 4.0–10.5)

## 2018-02-22 LAB — BASIC METABOLIC PANEL
Anion gap: 8 (ref 5–15)
BUN: 10 mg/dL (ref 6–20)
CO2: 23 mmol/L (ref 22–32)
Calcium: 8.9 mg/dL (ref 8.9–10.3)
Chloride: 107 mmol/L (ref 101–111)
Creatinine, Ser: 0.63 mg/dL (ref 0.44–1.00)
GFR calc Af Amer: >60 mL/min (ref >60)
GFR calc non Af Amer: >60 mL/min (ref >60)
Glucose, Bld: 105 mg/dL — ABNORMAL HIGH (ref 65–99)
Potassium: 3.8 mmol/L (ref 3.5–5.1)
Sodium: 138 mmol/L (ref 135–145)

## 2018-02-22 LAB — HCG, QUANTITATIVE, PREGNANCY: hCG, Beta Chain, Quant, S: 151 m[IU]/mL — ABNORMAL HIGH (ref <5)

## 2018-02-22 MED ORDER — PRENATAL COMPLETE 14-0.4 MG PO TABS: 1.0000 | ORAL_TABLET | Freq: Every day | ORAL | 0 refills | Status: AC

## 2018-02-22 NOTE — Discharge Instructions (Signed)
Take the prescribed medication as directed. Follow-up with your OBGYN in about 10-14 days for repeat hcg and Ultrasound. [facility] the [facility] or [facility] if any new issues arise prior to this.

## 2018-02-22 NOTE — ED Provider Notes (Signed)
[facility] DEPT Provider Note   CSN: [id number] Arrival date & time: 02/21/18  2031     History   Chief Complaint Chief Complaint  Patient presents with  . Abdominal Pain    HPI BIANKA LIBERATI is a 32 y.o. female.  The history is provided by the patient and medical records.  Abdominal Pain       32 y.o. F with hx of HTN, HLP, presenting to the ED for lower abdominal pain/cramping and some low back pain.  States this has been ongoing for a few days.  She denies nausea, vomiting, or diarrhea.  States her period is a few weeks late.  She denies urinary symptoms, vaginal discharge, or vaginal bleeding.  G2P3, no issues with deliveries previously.  Past Medical History:  Diagnosis Date  . High cholesterol   . Hypertension   . No pertinent past medical history     There are no active problems to display for this patient.   History reviewed. No pertinent surgical history.   OB History    Gravida  2   Para  2   Term  1   Preterm  1   AB      Living  3     SAB      TAB      Ectopic      Multiple      Live Births  2            Home Medications    Prior to Admission medications   Medication Sig Start Date End Date Taking? Authorizing Provider  acetaminophen (TYLENOL) 325 MG tablet Take 1 tablet (325 mg total) by mouth every 6 (six) hours as needed. 01/10/15   Sciacca, Marissa, PA-C  diphenhydrAMINE (SOMINEX) 25 MG tablet Take 25 mg by mouth at bedtime as needed for allergies or sleep.    [provider]  lisinopril (PRINIVIL,ZESTRIL) 20 MG tablet Take 1 tablet (20 mg total) by mouth daily. 07/24/16   Horton, Barbette Hair, MD  naproxen (NAPROSYN) 500 MG tablet Take 1 tablet (500 mg total) by mouth 2 (two) times daily. 07/24/16   Horton, Barbette Hair, MD    Family History Family History  Problem Relation Age of Onset  . Hypertension Mother   . Diabetes Mother   . Cancer Mother   . Hypertension Father   .  Diabetes Father     Social History Social History   Tobacco Use  . Smoking status: Never Smoker  . Smokeless tobacco: Never Used  Substance Use Topics  . Alcohol use: Yes    Comment: occasionally  . Drug use: No     Allergies   Patient has no known allergies.   Review of Systems Review of Systems  Gastrointestinal: Positive for abdominal pain.  Musculoskeletal: Positive for back pain.  All other systems reviewed and are negative.    Physical Exam Updated Vital Signs BP (!) 155/98 (BP Location: Left Arm)   Pulse 80   Temp 98.1 F (36.7 C) (Oral)   Resp 15   Ht 5\' 7"  (1.702 m)   Wt 90.7 kg (200 lb)   LMP 01/08/2018 (Approximate)   SpO2 98%   BMI 31.32 kg/m   Physical Exam  Constitutional: She is oriented to person, place, and time. She appears well-developed and well-nourished.  HENT:  Head: Normocephalic and atraumatic.  Mouth/Throat: Oropharynx is clear and moist.  Eyes: Pupils are equal, round, and reactive to light. Conjunctivae and  EOM are normal.  Neck: Normal range of motion.  Cardiovascular: Normal rate, regular rhythm and normal heart sounds.  Pulmonary/Chest: Effort normal and breath sounds normal.  Abdominal: Soft. Bowel sounds are normal. There is no tenderness. There is no rigidity, no guarding and no tenderness at McBurney's point.  Soft, benign  Musculoskeletal: Normal range of motion.  Neurological: She is alert and oriented to person, place, and time.  Skin: Skin is warm and dry.  Psychiatric: She has a normal mood and affect.  Nursing note and vitals reviewed.    ED Treatments / Results  Labs (all labs ordered are listed, but only abnormal results are displayed) Labs Reviewed  URINALYSIS, ROUTINE W REFLEX MICROSCOPIC - Abnormal; Notable for the following components:      Result Value   Leukocytes, UA TRACE (*)    All other components within normal limits  CBC WITH DIFFERENTIAL/PLATELET - Abnormal; Notable for the following  components:   WBC 11.3 (*)    All other components within normal limits  BASIC METABOLIC PANEL - Abnormal; Notable for the following components:   Glucose, Bld 105 (*)    All other components within normal limits  HCG, QUANTITATIVE, PREGNANCY - Abnormal; Notable for the following components:   hCG, Beta Chain, Quant, S 151 (*)    All other components within normal limits  POC URINE PREG, ED - Abnormal; Notable for the following components:   Preg Test, Ur POSITIVE (*)    All other components within normal limits    EKG None  Radiology US Ob Comp Less 14 Wks  Result Date: 02/22/2018 CLINICAL DATA:  Pregnant, assess location. Lower abdominal pain for 2 weeks. Beta HCG 151. EXAM: OBSTETRIC <14 WK [place] AND TRANSVAGINAL OB US TECHNIQUE: Both transabdominal and transvaginal ultrasound examinations were performed for complete evaluation of the gestation as well as the maternal uterus, adnexal regions, and pelvic cul-de-sac. Transvaginal technique was performed to assess early pregnancy. COMPARISON:  None. FINDINGS: Intrauterine gestational sac: Not present Yolk sac:  Not present Embryo:  Not present Cardiac Activity: Not applicable Subchorionic hemorrhage:  None visualized. Maternal uterus/adnexae: 2 hypoechoic intramural masses measuring to 15 mm compatible leiomyoma. Hypoechoic 2.1 cm probable corpus luteal cyst LEFT ovary. Trace free fluid in the pelvis. IMPRESSION: 1. Pregnancy not sonographically identified: Pregnancy of unknown location. Please note, with beta HCG of less than 3,000, pregnancy may not be sonographically evident. Recommend serial beta HCG and follow-up ultrasound in 10-14 days. Electronically Signed   By: Elon Alas M.D.   On: 02/22/2018 03:19   US Ob Transvaginal  Result Date: 02/22/2018 CLINICAL DATA:  Pregnant, assess location. Lower abdominal pain for 2 weeks. Beta HCG 151. EXAM: OBSTETRIC <14 WK [place] AND TRANSVAGINAL OB US TECHNIQUE: Both transabdominal and transvaginal  ultrasound examinations were performed for complete evaluation of the gestation as well as the maternal uterus, adnexal regions, and pelvic cul-de-sac. Transvaginal technique was performed to assess early pregnancy. COMPARISON:  None. FINDINGS: Intrauterine gestational sac: Not present Yolk sac:  Not present Embryo:  Not present Cardiac Activity: Not applicable Subchorionic hemorrhage:  None visualized. Maternal uterus/adnexae: 2 hypoechoic intramural masses measuring to 15 mm compatible leiomyoma. Hypoechoic 2.1 cm probable corpus luteal cyst LEFT ovary. Trace free fluid in the pelvis. IMPRESSION: 1. Pregnancy not sonographically identified: Pregnancy of unknown location. Please note, with beta HCG of less than 3,000, pregnancy may not be sonographically evident. Recommend serial beta HCG and follow-up ultrasound in 10-14 days. Electronically Signed   By: Sandie Ano  Bloomer M.D.   On: 02/22/2018 03:19    Procedures Procedures (including critical care time)  Medications Ordered in ED Medications - No data to display   Initial Impression / Assessment and Plan / ED Course  I have reviewed the triage vital signs and the nursing notes.  Pertinent labs & imaging results that were available during my care of the patient were reviewed by me and considered in my medical decision making (see chart for details).  32 year old female presenting to the ED with lower abdominal cramping and back pain.  Has been ongoing for a few days now.  Reports her period is a few weeks late.  She is afebrile and nontoxic.  Abdomen is soft and benign.  Pregnancy test obtained from triage is positive.  This was unexpected per patient.  UA without signs of infection.  Denies any current vaginal bleeding, discharge, or pelvic pain.  Will obtain labs, [place].  Labs overall reassuring.  hcg is lower than expected if menstrual is a few weeks late, only 151.  No gestational sac visualized on [place], likely too early.  Suspect that patient's  menstrual may not be as late as she thinks.  Will have her start prenatals, follow up with OBGYN in about 2 weeks for repeat hcg and [place].  Final Clinical Impressions(s) / ED Diagnoses   Final diagnoses:  Less than [redacted] weeks gestation of pregnancy    ED Discharge Orders        Ordered    Prenatal Vit-Fe Fumarate-FA (PRENATAL COMPLETE) 14-0.4 MG TABS  Daily     02/22/18 0404       Larene Pickett, PA-C 02/22/18 4580    Varney Biles, MD 02/23/18 [phone]

## 2018-03-06 DIAGNOSIS — Z3201 Encounter for pregnancy test, result positive: Secondary | ICD-10-CM | POA: Diagnosis not present

## 2018-03-08 DIAGNOSIS — O200 Threatened abortion: Secondary | ICD-10-CM | POA: Diagnosis not present

## 2018-03-08 DIAGNOSIS — Z3A01 Less than 8 weeks gestation of pregnancy: Secondary | ICD-10-CM | POA: Diagnosis not present

## 2018-03-08 DIAGNOSIS — O468X1 Other antepartum hemorrhage, first trimester: Secondary | ICD-10-CM | POA: Diagnosis not present

## 2018-03-08 DIAGNOSIS — Z79899 Other long term (current) drug therapy: Secondary | ICD-10-CM | POA: Diagnosis not present

## 2018-03-08 DIAGNOSIS — R109 Unspecified abdominal pain: Secondary | ICD-10-CM | POA: Diagnosis not present

## 2018-03-08 DIAGNOSIS — I10 Essential (primary) hypertension: Secondary | ICD-10-CM | POA: Diagnosis not present

## 2018-03-08 DIAGNOSIS — O26891 Other specified pregnancy related conditions, first trimester: Secondary | ICD-10-CM | POA: Diagnosis not present

## 2018-03-08 DIAGNOSIS — O209 Hemorrhage in early pregnancy, unspecified: Secondary | ICD-10-CM | POA: Diagnosis not present

## 2018-03-08 DIAGNOSIS — R102 Pelvic and perineal pain: Secondary | ICD-10-CM | POA: Diagnosis not present

## 2018-03-17 DIAGNOSIS — O021 Missed abortion: Secondary | ICD-10-CM | POA: Diagnosis not present

## 2018-06-30 ENCOUNTER — Emergency Department (HOSPITAL_COMMUNITY)
Admission: EM | Admit: 2018-06-30 | Discharge: 2018-07-01 | Disposition: A | Discharge status: Home / Self Care | Payer: BLUE CROSS/BLUE SHIELD | Attending: Emergency Medicine | Admitting: Emergency Medicine

## 2018-06-30 ENCOUNTER — Encounter (HOSPITAL_COMMUNITY): Payer: Self-pay | Admitting: Emergency Medicine

## 2018-06-30 DIAGNOSIS — Z79899 Other long term (current) drug therapy: Secondary | ICD-10-CM | POA: Insufficient documentation

## 2018-06-30 DIAGNOSIS — I10 Essential (primary) hypertension: Secondary | ICD-10-CM | POA: Diagnosis not present

## 2018-06-30 LAB — I-STAT CHEM 8, ED
BUN: 12 mg/dL (ref 6–20)
Calcium, Ion: 1.13 mmol/L — ABNORMAL LOW (ref 1.15–1.40)
Chloride: 106 mmol/L (ref 98–111)
Creatinine, Ser: 0.8 mg/dL (ref 0.44–1.00)
Glucose, Bld: 129 mg/dL — ABNORMAL HIGH (ref 70–99)
HCT: 39 % (ref 36.0–46.0)
Hemoglobin: 13.3 g/dL (ref 12.0–15.0)
Potassium: 3.8 mmol/L (ref 3.5–5.1)
Sodium: 137 mmol/L (ref 135–145)
TCO2: 21 mmol/L — ABNORMAL LOW (ref 22–32)

## 2018-06-30 NOTE — ED Triage Notes (Signed)
Pt presents to ED for assessment after being out of her HTN meds (amlodipine) x 1 week.  States it expired, then when she called her doctor she noted she owed them money so she can't get a prescription refilled.  Currently 159/113.  C/o headache.

## 2018-06-30 NOTE — ED Provider Notes (Signed)
Patient placed in Quick Look pathway, seen and evaluated   Chief Complaint: medication refill  HPI:   [name] is a 32 y.o. female who present to the ED for medication refill for her BP. Norvasc 10 mg. Patient reports she called her PCP for a refill and they told her she owed them money and would not refill the medication. Patient reports having a headache. She has been out of her medication for a week.   ROS: Neuro: headache that is mild  Physical Exam:  BP (!) 159/113 (BP Location: Right Arm)   Pulse 86   Temp 99.4 F (37.4 C) (Oral)   Resp 16   SpO2 95%    Gen: No distress  Neuro: Awake and Alert  Skin: Warm and dry       Initiation of care has begun. The patient has been counseled on the process, plan, and necessity for staying for the completion/evaluation, and the remainder of the medical screening examination    Ashley Murrain, NP 06/30/18 2029    Jola Schmidt, MD 07/01/18 [phone]

## 2018-07-01 LAB — I-STAT BETA HCG BLOOD, ED (MC, WL, AP ONLY): I-stat hCG, quantitative: <5 m[IU]/mL (ref <5)

## 2018-07-01 MED ORDER — AMLODIPINE BESYLATE 5 MG PO TABS
10.0000 mg | ORAL_TABLET | Freq: Once | ORAL | Status: AC
Start: 2018-07-01 — End: 2018-07-01
  Administered 2018-07-01: 10 mg via ORAL
  Filled 2018-07-01: qty 2

## 2018-07-01 MED ORDER — AMLODIPINE BESYLATE 10 MG PO TABS: 10.0000 mg | ORAL_TABLET | Freq: Every day | ORAL | 0 refills | Status: DC

## 2018-07-01 NOTE — Discharge Instructions (Addendum)
Please take your blood pressure medicine as prescribed.  Please follow-up with primary care doctor the next 1 to 2 weeks.  Return to ED if you develop any chest pain, shortness of breath, worsening headaches, vision changes for any other reason.

## 2018-07-01 NOTE — ED Notes (Signed)
Patient verbalizes understanding of discharge instructions. Opportunity for questioning and answers were provided. Armband removed by staff, pt discharged from ED, pt ambulatory to the lobby.

## 2018-07-01 NOTE — ED Provider Notes (Signed)
[facility] EMERGENCY DEPARTMENT Provider Note   CSN: [id number] Arrival date & time: 06/30/18  2017     History   Chief Complaint Chief Complaint  Patient presents with  . Hypertension    HPI [name] is a 32 y.o. female.  HPI 32 year old female past medical history significant for hypertension presents to the ED for evaluation of elevated blood pressure.  Patient states that she has been out of her blood pressure medicine for the past week.  She called her primary care doctor today but told her they would not refill her medicine because they have the money.  Patient reports having a slight headache that has been intermittent.  Reports some intermittent blurry vision but currently denies any blurry vision at this time.  Denies any chest pain or shortness of breath.  She is been trying vinegar at home for her blood pressure control which is not helping. Nothing makes better or worse.  Patient denies any other associated symptoms. Past Medical History:  Diagnosis Date  . High cholesterol   . Hypertension   . No pertinent past medical history     There are no active problems to display for this patient.   History reviewed. No pertinent surgical history.   OB History    Gravida  2   Para  2   Term  1   Preterm  1   AB      Living  3     SAB      TAB      Ectopic      Multiple      Live Births  2            Home Medications    Prior to Admission medications   Medication Sig Start Date End Date Taking? Authorizing Provider  acetaminophen (TYLENOL) 325 MG tablet Take 1 tablet (325 mg total) by mouth every 6 (six) hours as needed. Patient not taking: Reported on 02/22/2018 01/10/15   Sciacca, Mable Fill, PA-C  amLODipine (NORVASC) 10 MG tablet Take 1 tablet (10 mg total) by mouth daily. 07/01/18   Doristine Devoid, PA-C  lisinopril (PRINIVIL,ZESTRIL) 20 MG tablet Take 1 tablet (20 mg total) by mouth daily. Patient not taking:  Reported on 02/22/2018 07/24/16   Horton, Barbette Hair, MD  naproxen (NAPROSYN) 500 MG tablet Take 1 tablet (500 mg total) by mouth 2 (two) times daily. Patient not taking: Reported on 02/22/2018 07/24/16   Horton, Barbette Hair, MD  oxybutynin (DITROPAN-XL) 5 MG 24 hr tablet Take 5 mg by mouth at bedtime.    [provider]  Prenatal Vit-Fe Fumarate-FA (PRENATAL COMPLETE) 14-0.4 MG TABS Take 1 tablet by mouth daily. 02/22/18   Larene Pickett, PA-C    Family History Family History  Problem Relation Age of Onset  . Hypertension Mother   . Diabetes Mother   . Cancer Mother   . Hypertension Father   . Diabetes Father     Social History Social History   Tobacco Use  . Smoking status: Never Smoker  . Smokeless tobacco: Never Used  Substance Use Topics  . Alcohol use: Yes    Comment: occasionally  . Drug use: No     Allergies   Patient has no known allergies.   Review of Systems Review of Systems  Constitutional: Negative for fever.  Eyes: Positive for visual disturbance (intermittent).  Respiratory: Negative for shortness of breath.   Cardiovascular: Negative for chest pain.  Gastrointestinal:  Negative for vomiting.  Skin: Negative for rash.  Neurological: Positive for headaches. Negative for weakness and numbness.     Physical Exam Updated Vital Signs BP (!) 145/97   Pulse 70   Temp 99.4 F (37.4 C) (Oral)   Resp 16   SpO2 100%   Physical Exam  Constitutional: She is oriented to person, place, and time. She appears well-developed and well-nourished. No distress.  HENT:  Head: Normocephalic and atraumatic.  Eyes: Pupils are equal, round, and reactive to light. Conjunctivae and EOM are normal. Right eye exhibits no discharge. Left eye exhibits no discharge. No scleral icterus.  Fundoscopic exam:      The right eye shows no papilledema.       The left eye shows no papilledema.  Neck: Normal range of motion. Neck supple.  Cardiovascular: Normal rate, regular  rhythm, normal heart sounds and intact distal pulses.  Pulmonary/Chest: Effort normal and breath sounds normal. No stridor. No respiratory distress. She has no wheezes. She has no rales. She exhibits no tenderness.  Musculoskeletal: Normal range of motion.  Neurological: She is alert and oriented to person, place, and time.  The patient is alert, attentive, and oriented x 3. Speech is clear. Cranial nerve II-VII grossly intact. Negative pronator drift. Sensation intact. Strength 5/5 in all extremities. Reflexes 2+ and symmetric at biceps, triceps, knees, and ankles. Rapid alternating movement and fine finger movements intact. Romberg is absent. Posture and gait normal.   Skin: Skin is warm and dry. Capillary refill takes less than 2 seconds. No pallor.  Psychiatric: Her behavior is normal. Judgment and thought content normal.  Nursing note and vitals reviewed.    ED Treatments / Results  Labs (all labs ordered are listed, but only abnormal results are displayed) Labs Reviewed  I-STAT CHEM 8, ED - Abnormal; Notable for the following components:      Result Value   Glucose, Bld 129 (*)    Calcium, Ion 1.13 (*)    TCO2 21 (*)    All other components within normal limits  I-STAT BETA HCG BLOOD, ED ([facility], [facility], [facility] ONLY)    EKG None  Radiology No results found.  Procedures Procedures (including critical care time)  Medications Ordered in ED Medications  amLODipine (NORVASC) tablet 10 mg (10 mg Oral Given 07/01/18 0051)     Initial Impression / Assessment and Plan / ED Course  I have reviewed the triage vital signs and the nursing notes.  Pertinent labs & imaging results that were available during my care of the patient were reviewed by me and considered in my medical decision making (see chart for details).     Presents today for need of blood pressure medication refill.  Reports intermittent headaches and blurred vision but currently denies at this time.  Vital signs reassuring  except for some mild hypertension.  Patient had lab work in triage that showed normal creatinine.  Denies chest pain or shortness of breath.  No indication for further cardiac work-up at this time.  Low suspicion for ACS, dissection, ICH.  Patient has no focal neuro deficit on exam.  Pregnancy test was negative.  Will provide 1 month refill and have close outpatient follow-up PCP.  Discussed reasons to return to ED immediately.  Pt is hemodynamically stable, in NAD, & able to ambulate in the ED. Evaluation does not show pathology that would require ongoing emergent intervention or inpatient treatment. I explained the diagnosis to the patient. Pain has been managed & has  no complaints prior to dc. Pt is comfortable with above plan and is stable for discharge at this time. All questions were answered prior to disposition. Strict return precautions for f/u to the ED were discussed. Encouraged follow up with PCP.   Final Clinical Impressions(s) / ED Diagnoses   Final diagnoses:  Hypertension, unspecified type    ED Discharge Orders         Ordered    amLODipine (NORVASC) 10 MG tablet  Daily     07/01/18 0051           Doristine Devoid, PA-C 07/01/18 0117    Orpah Greek, MD 07/02/18 [phone]

## 2018-07-26 ENCOUNTER — Other Ambulatory Visit: Payer: Self-pay | Admitting: Gerontology

## 2018-07-26 ENCOUNTER — Ambulatory Visit (INDEPENDENT_AMBULATORY_CARE_PROVIDER_SITE_OTHER): Payer: Self-pay

## 2018-07-26 DIAGNOSIS — M79644 Pain in right finger(s): Secondary | ICD-10-CM

## 2018-07-26 DIAGNOSIS — R52 Pain, unspecified: Secondary | ICD-10-CM

## 2018-08-11 DIAGNOSIS — N926 Irregular menstruation, unspecified: Secondary | ICD-10-CM | POA: Diagnosis not present

## 2018-10-12 ENCOUNTER — Encounter (HOSPITAL_COMMUNITY): Payer: Self-pay | Admitting: Emergency Medicine

## 2018-10-12 ENCOUNTER — Emergency Department (HOSPITAL_COMMUNITY)
Admission: EM | Admit: 2018-10-12 | Discharge: 2018-10-12 | Disposition: A | Discharge status: Home / Self Care | Payer: BLUE CROSS/BLUE SHIELD | Attending: Emergency Medicine | Admitting: Emergency Medicine

## 2018-10-12 ENCOUNTER — Other Ambulatory Visit: Payer: Self-pay

## 2018-10-12 DIAGNOSIS — J101 Influenza due to other identified influenza virus with other respiratory manifestations: Secondary | ICD-10-CM | POA: Diagnosis not present

## 2018-10-12 DIAGNOSIS — Z79899 Other long term (current) drug therapy: Secondary | ICD-10-CM | POA: Insufficient documentation

## 2018-10-12 DIAGNOSIS — J111 Influenza due to unidentified influenza virus with other respiratory manifestations: Secondary | ICD-10-CM | POA: Diagnosis not present

## 2018-10-12 DIAGNOSIS — R51 Headache: Secondary | ICD-10-CM | POA: Diagnosis not present

## 2018-10-12 DIAGNOSIS — I10 Essential (primary) hypertension: Secondary | ICD-10-CM | POA: Diagnosis not present

## 2018-10-12 DIAGNOSIS — J069 Acute upper respiratory infection, unspecified: Secondary | ICD-10-CM | POA: Diagnosis not present

## 2018-10-12 DIAGNOSIS — R6889 Other general symptoms and signs: Secondary | ICD-10-CM

## 2018-10-12 LAB — INFLUENZA PANEL BY PCR (TYPE A & B)
Influenza A By PCR: POSITIVE — AB
Influenza B By PCR: NEGATIVE

## 2018-10-12 MED ORDER — IBUPROFEN 400 MG PO TABS
600.0000 mg | ORAL_TABLET | Freq: Once | ORAL | Status: AC
  Administered 2018-10-12: 600 mg via ORAL
  Filled 2018-10-12: qty 1

## 2018-10-12 MED ORDER — OSELTAMIVIR PHOSPHATE 75 MG PO CAPS: 75.0000 mg | ORAL_CAPSULE | Freq: Two times a day (BID) | ORAL | 0 refills | Status: DC

## 2018-10-12 NOTE — ED Provider Notes (Signed)
[facility] EMERGENCY DEPARTMENT Provider Note   CSN: [id number] Arrival date & time: 10/12/18  1136     History   Chief Complaint Chief Complaint  Patient presents with  . body aches    HPI [name] is a 32 y.o. female.  She is presenting to the emergency department with 1 day of acute onset body aches headache sore throat and dry cough.  She has not checked to see if she is had a temperature.  She feels generally unwell.  No chest pain no abdominal pain no nausea vomiting diarrhea or urinary symptoms.  No vaginal bleeding or discharge.  She has multiple sick contacts and did not get a flu shot this year.  The history is provided by the patient.  Influenza  Presenting symptoms: cough, fatigue, headache, myalgias and sore throat   Presenting symptoms: no diarrhea, no fever, no nausea, no shortness of breath and no vomiting   Severity:  Moderate Onset quality:  Sudden Progression:  Worsening Chronicity:  New Relieved by:  None tried Worsened by:  Nothing Ineffective treatments:  None tried Associated symptoms: no chills, no mental status change, no neck stiffness and no syncope   Risk factors: sick contacts     Past Medical History:  Diagnosis Date  . High cholesterol   . Hypertension   . No pertinent past medical history     There are no active problems to display for this patient.   History reviewed. No pertinent surgical history.   OB History    Gravida  2   Para  2   Term  1   Preterm  1   AB      Living  3     SAB      TAB      Ectopic      Multiple      Live Births  2            Home Medications    Prior to Admission medications   Medication Sig Start Date End Date Taking? Authorizing Provider  acetaminophen (TYLENOL) 325 MG tablet Take 1 tablet (325 mg total) by mouth every 6 (six) hours as needed. Patient not taking: Reported on 02/22/2018 01/10/15   Sciacca, Mable Fill, PA-C  amLODipine (NORVASC) 10 MG  tablet Take 1 tablet (10 mg total) by mouth daily. 07/01/18   Doristine Devoid, PA-C  lisinopril (PRINIVIL,ZESTRIL) 20 MG tablet Take 1 tablet (20 mg total) by mouth daily. Patient not taking: Reported on 02/22/2018 07/24/16   Horton, Barbette Hair, MD  naproxen (NAPROSYN) 500 MG tablet Take 1 tablet (500 mg total) by mouth 2 (two) times daily. Patient not taking: Reported on 02/22/2018 07/24/16   Horton, Barbette Hair, MD  oxybutynin (DITROPAN-XL) 5 MG 24 hr tablet Take 5 mg by mouth at bedtime.    [provider]  Prenatal Vit-Fe Fumarate-FA (PRENATAL COMPLETE) 14-0.4 MG TABS Take 1 tablet by mouth daily. 02/22/18   Larene Pickett, PA-C    Family History Family History  Problem Relation Age of Onset  . Hypertension Mother   . Diabetes Mother   . Cancer Mother   . Hypertension Father   . Diabetes Father     Social History Social History   Tobacco Use  . Smoking status: Never Smoker  . Smokeless tobacco: Never Used  Substance Use Topics  . Alcohol use: Yes    Comment: occasionally  . Drug use: No     Allergies  Patient has no known allergies.   Review of Systems Review of Systems  Constitutional: Positive for fatigue. Negative for chills and fever.  HENT: Positive for sore throat.   Eyes: Negative for visual disturbance.  Respiratory: Positive for cough. Negative for shortness of breath.   Cardiovascular: Negative for chest pain.  Gastrointestinal: Negative for abdominal pain, diarrhea, nausea and vomiting.  Genitourinary: Negative for dysuria, vaginal bleeding and vaginal discharge.  Musculoskeletal: Positive for myalgias. Negative for neck stiffness.  Skin: Negative for rash.  Neurological: Positive for headaches.     Physical Exam Updated Vital Signs BP 134/85 (BP Location: Right Arm)   Pulse 94   Temp (!) 100.4 F (38 C) (Oral)   Resp 16   Ht 5\' 7"  (1.702 m)   Wt 90.7 kg   SpO2 100%   BMI 31.32 kg/m   Physical Exam Vitals signs and nursing note  reviewed.  Constitutional:      General: She is not in acute distress.    Appearance: She is well-developed.  HENT:     Head: Normocephalic and atraumatic.     Right Ear: Tympanic membrane normal.     Left Ear: Tympanic membrane normal.     Nose: Nose normal.     Mouth/Throat:     Mouth: Mucous membranes are moist.     Pharynx: Oropharynx is clear.  Eyes:     Conjunctiva/sclera: Conjunctivae normal.  Neck:     Musculoskeletal: Neck supple.  Cardiovascular:     Rate and Rhythm: Normal rate and regular rhythm.     Heart sounds: No murmur.  Pulmonary:     Effort: Pulmonary effort is normal. No respiratory distress.     Breath sounds: Normal breath sounds.  Abdominal:     Palpations: Abdomen is soft.     Tenderness: There is no abdominal tenderness.  Musculoskeletal: Normal range of motion.  Skin:    General: Skin is warm and dry.     Capillary Refill: Capillary refill takes less than 2 seconds.  Neurological:     General: No focal deficit present.     Mental Status: She is alert.      ED Treatments / Results  Labs (all labs ordered are listed, but only abnormal results are displayed) Labs Reviewed  INFLUENZA PANEL BY PCR (TYPE A & B) - Abnormal; Notable for the following components:      Result Value   Influenza A By PCR POSITIVE (*)    All other components within normal limits    EKG None  Radiology No results found.  Procedures Procedures (including critical care time)  Medications Ordered in ED Medications - No data to display   Initial Impression / Assessment and Plan / ED Course  I have reviewed the triage vital signs and the nursing notes.  Pertinent labs & imaging results that were available during my care of the patient were reviewed by me and considered in my medical decision making (see chart for details).      Final Clinical Impressions(s) / ED Diagnoses   Final diagnoses:  Flu-like symptoms    ED Discharge Orders         Ordered     oseltamivir (TAMIFLU) 75 MG capsule  Every 12 hours     10/12/18 1230           Hayden Rasmussen, MD 10/12/18 1511

## 2018-10-12 NOTE — ED Triage Notes (Signed)
Pt is having body aches and fatigue for today. Denies other symptoms

## 2018-10-12 NOTE — ED Notes (Signed)
Patient verbalizes understanding of discharge instructions. Opportunity for questioning and answers were provided. Armband removed by staff, pt discharged from ED.  

## 2018-10-12 NOTE — Discharge Instructions (Addendum)
You were evaluated in the emergency department for flulike symptoms.  You had a flu test that was sent off and the results will be back sometime today.  We are starting you on some flu medication that may shorten the course and the severity of the symptoms.  You should drink plenty of fluids and take Tylenol or ibuprofen as needed for pain or fever.  Follow-up with your doctor return if any worsening symptoms.

## 2018-10-30 DIAGNOSIS — I10 Essential (primary) hypertension: Secondary | ICD-10-CM | POA: Diagnosis not present

## 2019-02-26 DIAGNOSIS — Z6834 Body mass index (BMI) 34.0-34.9, adult: Secondary | ICD-10-CM | POA: Diagnosis not present

## 2019-02-26 DIAGNOSIS — Z113 Encounter for screening for infections with a predominantly sexual mode of transmission: Secondary | ICD-10-CM | POA: Diagnosis not present

## 2019-02-26 DIAGNOSIS — Z01419 Encounter for gynecological examination (general) (routine) without abnormal findings: Secondary | ICD-10-CM | POA: Diagnosis not present

## 2019-02-26 DIAGNOSIS — Z3202 Encounter for pregnancy test, result negative: Secondary | ICD-10-CM | POA: Diagnosis not present

## 2019-02-26 DIAGNOSIS — E669 Obesity, unspecified: Secondary | ICD-10-CM | POA: Diagnosis not present

## 2019-02-26 DIAGNOSIS — N926 Irregular menstruation, unspecified: Secondary | ICD-10-CM | POA: Diagnosis not present

## 2019-02-26 DIAGNOSIS — Z124 Encounter for screening for malignant neoplasm of cervix: Secondary | ICD-10-CM | POA: Diagnosis not present

## 2019-05-01 DIAGNOSIS — I10 Essential (primary) hypertension: Secondary | ICD-10-CM | POA: Diagnosis not present

## 2019-07-18 DIAGNOSIS — N760 Acute vaginitis: Secondary | ICD-10-CM | POA: Diagnosis not present

## 2019-08-29 DIAGNOSIS — N911 Secondary amenorrhea: Secondary | ICD-10-CM | POA: Diagnosis not present

## 2019-08-29 DIAGNOSIS — Z3201 Encounter for pregnancy test, result positive: Secondary | ICD-10-CM | POA: Diagnosis not present

## 2019-08-29 DIAGNOSIS — Z368A Encounter for antenatal screening for other genetic defects: Secondary | ICD-10-CM | POA: Diagnosis not present

## 2019-08-29 DIAGNOSIS — Z23 Encounter for immunization: Secondary | ICD-10-CM | POA: Diagnosis not present

## 2019-09-12 DIAGNOSIS — Z113 Encounter for screening for infections with a predominantly sexual mode of transmission: Secondary | ICD-10-CM | POA: Diagnosis not present

## 2019-09-12 DIAGNOSIS — Z3481 Encounter for supervision of other normal pregnancy, first trimester: Secondary | ICD-10-CM | POA: Diagnosis not present

## 2019-09-12 DIAGNOSIS — O3680X0 Pregnancy with inconclusive fetal viability, not applicable or unspecified: Secondary | ICD-10-CM | POA: Diagnosis not present

## 2019-09-12 DIAGNOSIS — Z368A Encounter for antenatal screening for other genetic defects: Secondary | ICD-10-CM | POA: Diagnosis not present

## 2019-09-12 DIAGNOSIS — Z3A01 Less than 8 weeks gestation of pregnancy: Secondary | ICD-10-CM | POA: Diagnosis not present

## 2019-09-12 DIAGNOSIS — O139 Gestational [pregnancy-induced] hypertension without significant proteinuria, unspecified trimester: Secondary | ICD-10-CM | POA: Diagnosis not present

## 2019-09-12 DIAGNOSIS — Z3689 Encounter for other specified antenatal screening: Secondary | ICD-10-CM | POA: Diagnosis not present

## 2019-09-22 IMAGING — US US OB TRANSVAGINAL
1 series · 14 of 28 positions shown · non-contrast
Comparison: None.

CLINICAL DATA: Pregnant, assess location. Lower abdominal pain for
2 weeks. Beta HCG 151.

EXAM:
OBSTETRIC <14 WK US AND TRANSVAGINAL OB US
TECHNIQUE: Both transabdominal and transvaginal ultrasound examinations were
performed for complete evaluation of the gestation as well as the
maternal uterus, adnexal regions, and pelvic cul-de-sac.
Transvaginal technique was performed to assess early pregnancy.

[Series 1: us ob transvaginal · 60 acquisitions, 14 frames shown]
[im 3/60]
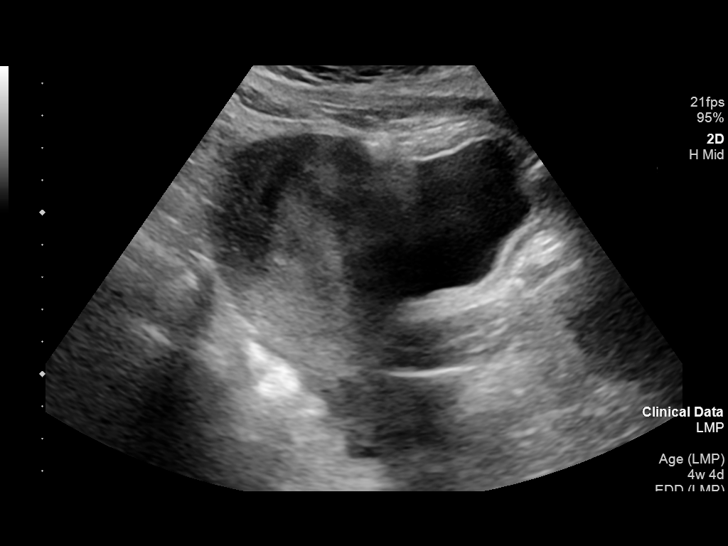
[im 7/60]
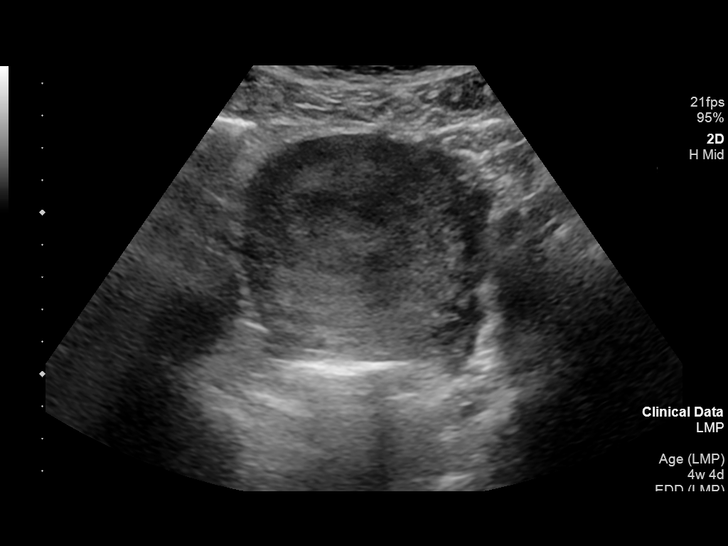
[im 11/60]
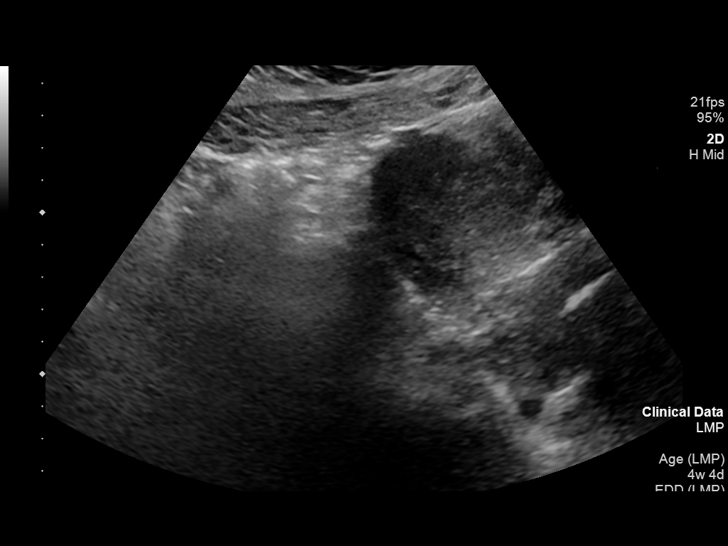
[im 16/60]
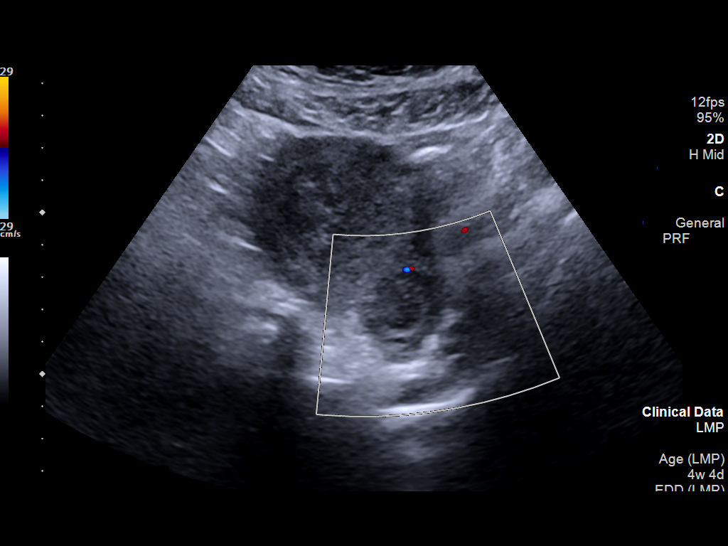
[im 20/60]
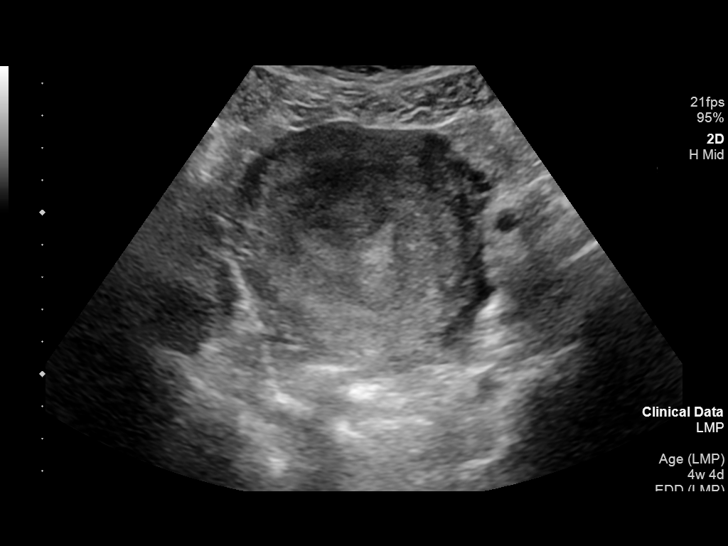
[im 25/60]
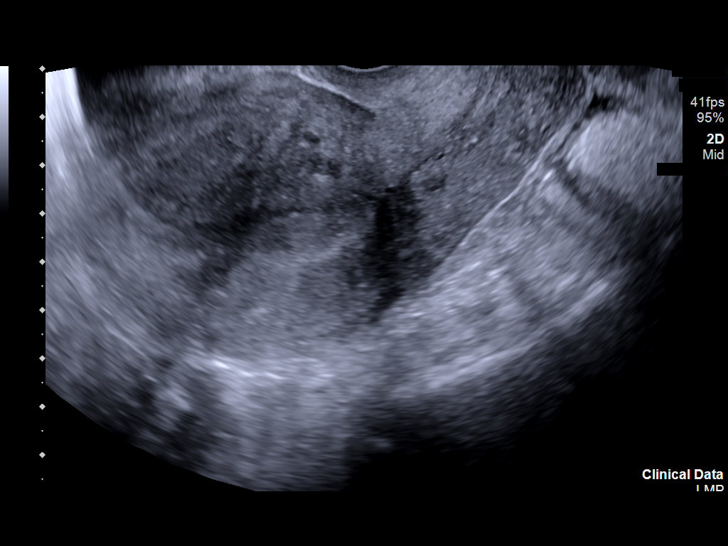
[im 29/60]
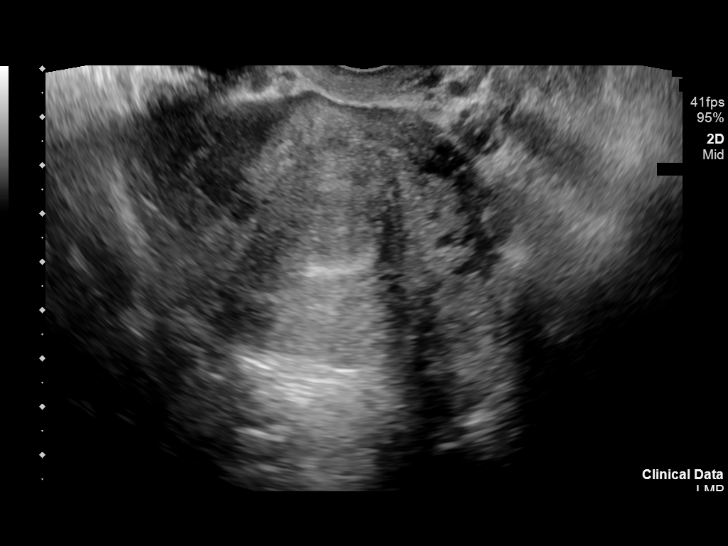
[im 33/60]
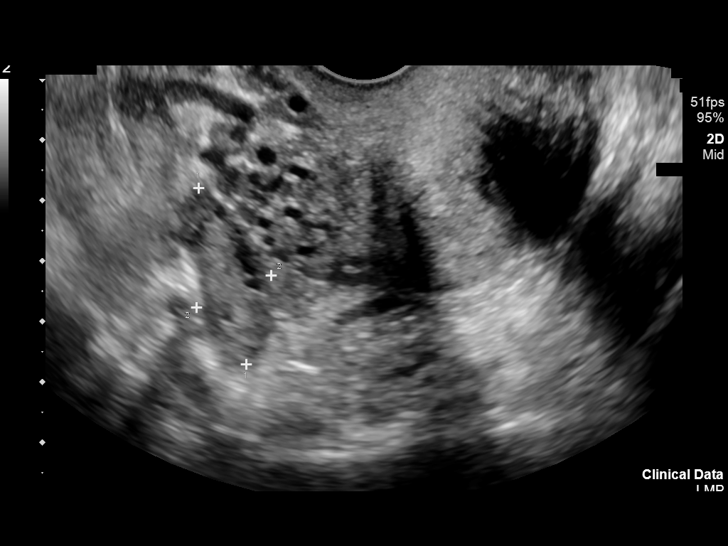
[im 38/60]
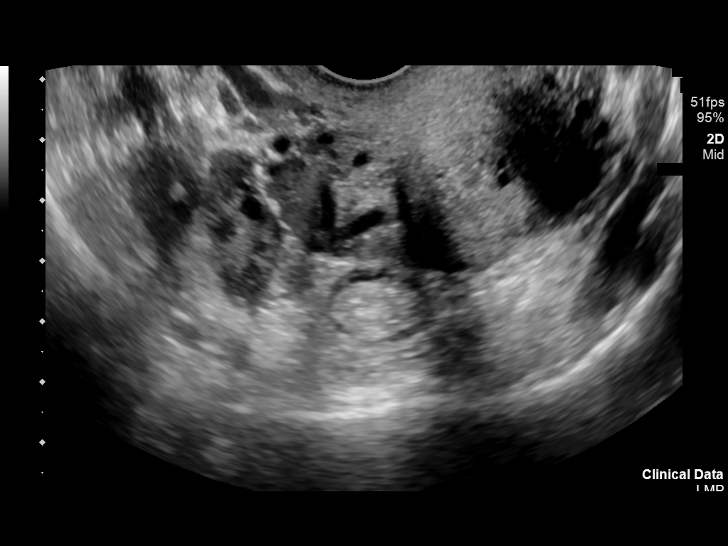
[im 42/60]
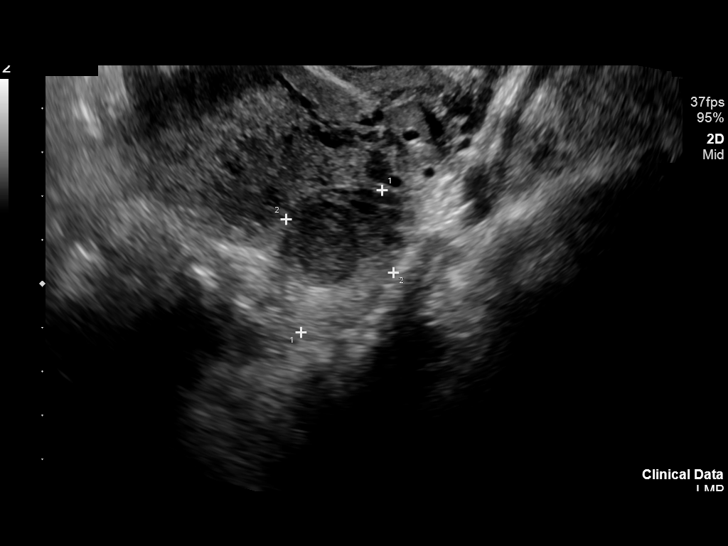
[im 46/60]
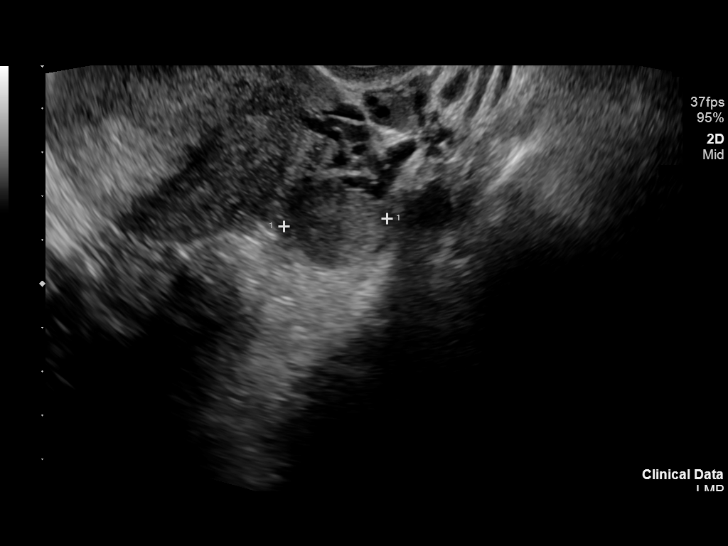
[im 51/60]
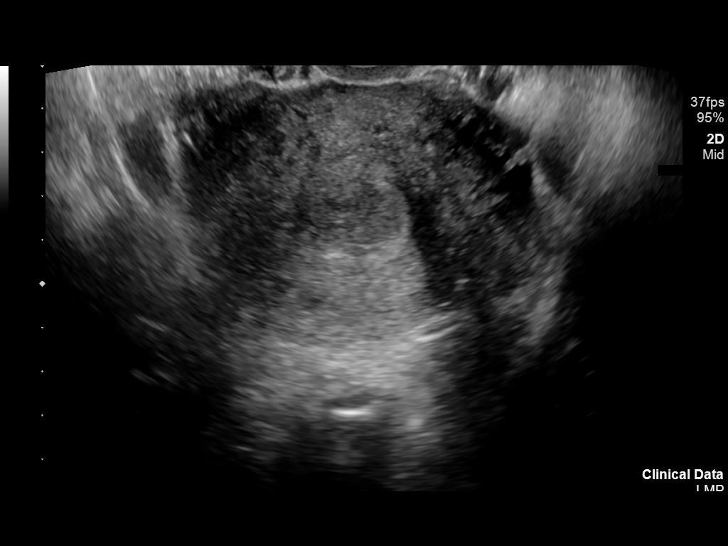
[im 55/60]
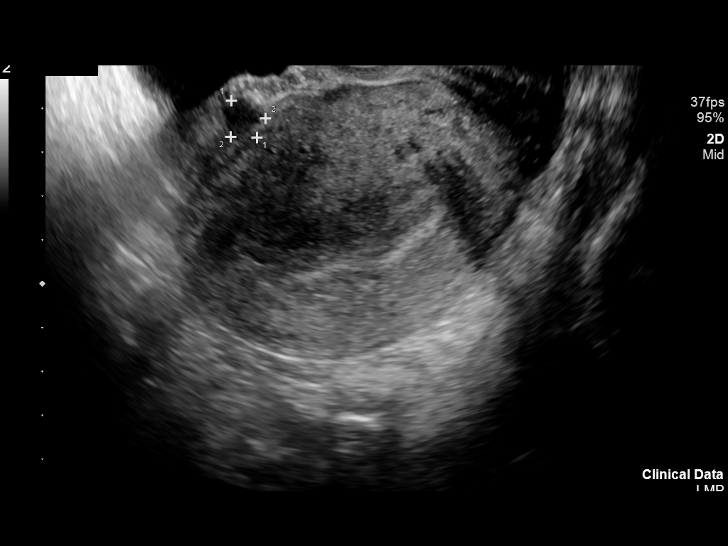
[im 60/60]
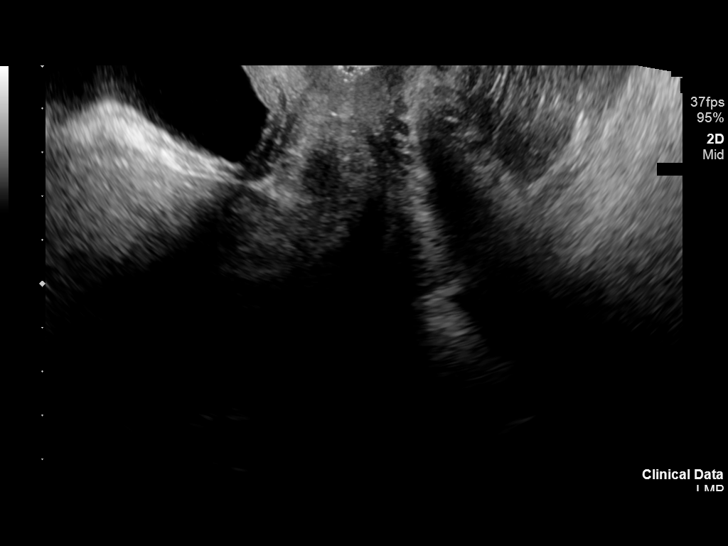

[14 of 28 positions shown; findings below may reference images not displayed]

FINDINGS: Intrauterine gestational sac: Not present

Yolk sac:  Not present

Embryo:  Not present

Cardiac Activity: Not applicable

Subchorionic hemorrhage:  None visualized.

Maternal uterus/adnexae: 2 hypoechoic intramural masses measuring to
15 mm compatible leiomyoma. Hypoechoic 2.1 cm probable corpus luteal
cyst LEFT ovary. Trace free fluid in the pelvis.
IMPRESSION: 1. Pregnancy not sonographically identified: Pregnancy of unknown
location. Please note, with beta HCG of less than [DATE], pregnancy
may not be sonographically evident. Recommend serial beta HCG and
follow-up ultrasound in 10-14 days.

## 2019-10-10 DIAGNOSIS — Z03818 Encounter for observation for suspected exposure to other biological agents ruled out: Secondary | ICD-10-CM | POA: Diagnosis not present

## 2019-10-19 DIAGNOSIS — Z331 Pregnant state, incidental: Secondary | ICD-10-CM | POA: Diagnosis not present

## 2019-10-19 DIAGNOSIS — I10 Essential (primary) hypertension: Secondary | ICD-10-CM | POA: Diagnosis not present

## 2019-10-22 DIAGNOSIS — Z3481 Encounter for supervision of other normal pregnancy, first trimester: Secondary | ICD-10-CM | POA: Diagnosis not present

## 2019-10-22 DIAGNOSIS — Z1371 Encounter for nonprocreative screening for genetic disease carrier status: Secondary | ICD-10-CM | POA: Diagnosis not present

## 2019-10-31 DIAGNOSIS — Z20828 Contact with and (suspected) exposure to other viral communicable diseases: Secondary | ICD-10-CM | POA: Diagnosis not present

## 2019-11-06 DIAGNOSIS — Z03818 Encounter for observation for suspected exposure to other biological agents ruled out: Secondary | ICD-10-CM | POA: Diagnosis not present

## 2019-12-07 DIAGNOSIS — Z363 Encounter for antenatal screening for malformations: Secondary | ICD-10-CM | POA: Diagnosis not present

## 2019-12-07 DIAGNOSIS — Z3A18 18 weeks gestation of pregnancy: Secondary | ICD-10-CM | POA: Diagnosis not present

## 2020-01-04 DIAGNOSIS — Z3A22 22 weeks gestation of pregnancy: Secondary | ICD-10-CM | POA: Diagnosis not present

## 2020-01-04 DIAGNOSIS — Z362 Encounter for other antenatal screening follow-up: Secondary | ICD-10-CM | POA: Diagnosis not present

## 2020-01-30 DIAGNOSIS — Z3A26 26 weeks gestation of pregnancy: Secondary | ICD-10-CM | POA: Diagnosis not present

## 2020-01-30 DIAGNOSIS — R102 Pelvic and perineal pain: Secondary | ICD-10-CM | POA: Diagnosis not present

## 2020-01-30 DIAGNOSIS — O10012 Pre-existing essential hypertension complicating pregnancy, second trimester: Secondary | ICD-10-CM | POA: Diagnosis not present

## 2020-02-02 ENCOUNTER — Encounter (HOSPITAL_COMMUNITY): Payer: Self-pay

## 2020-02-02 ENCOUNTER — Inpatient Hospital Stay (HOSPITAL_COMMUNITY)
Admission: AD | Admit: 2020-02-02 | Discharge: 2020-02-02 | Disposition: A | Discharge status: Home / Self Care | Payer: BC Managed Care – PPO | Attending: Obstetrics and Gynecology | Admitting: Obstetrics and Gynecology

## 2020-02-02 ENCOUNTER — Other Ambulatory Visit: Payer: Self-pay

## 2020-02-02 DIAGNOSIS — O479 False labor, unspecified: Secondary | ICD-10-CM

## 2020-02-02 DIAGNOSIS — O10012 Pre-existing essential hypertension complicating pregnancy, second trimester: Secondary | ICD-10-CM

## 2020-02-02 DIAGNOSIS — O10912 Unspecified pre-existing hypertension complicating pregnancy, second trimester: Secondary | ICD-10-CM | POA: Diagnosis not present

## 2020-02-02 DIAGNOSIS — O4700 False labor before 37 completed weeks of gestation, unspecified trimester: Secondary | ICD-10-CM

## 2020-02-02 DIAGNOSIS — Z3A26 26 weeks gestation of pregnancy: Secondary | ICD-10-CM | POA: Diagnosis not present

## 2020-02-02 LAB — URINALYSIS, ROUTINE W REFLEX MICROSCOPIC
Bilirubin Urine: NEGATIVE
Glucose, UA: NEGATIVE mg/dL
Hgb urine dipstick: NEGATIVE
Ketones, ur: NEGATIVE mg/dL
Leukocytes,Ua: NEGATIVE
Nitrite: NEGATIVE
Protein, ur: 30 mg/dL — AB
Specific Gravity, Urine: 1.029 (ref 1.005–1.030)
pH: 5 (ref 5.0–8.0)

## 2020-02-02 LAB — COMPREHENSIVE METABOLIC PANEL
ALT: 24 U/L (ref 0–44)
AST: 20 U/L (ref 15–41)
Albumin: 2.6 g/dL — ABNORMAL LOW (ref 3.5–5.0)
Alkaline Phosphatase: 167 U/L — ABNORMAL HIGH (ref 38–126)
Anion gap: 12 (ref 5–15)
BUN: 9 mg/dL (ref 6–20)
CO2: 18 mmol/L — ABNORMAL LOW (ref 22–32)
Calcium: 9.6 mg/dL (ref 8.9–10.3)
Chloride: 106 mmol/L (ref 98–111)
Creatinine, Ser: 0.62 mg/dL (ref 0.44–1.00)
GFR calc Af Amer: >60 mL/min (ref >60)
GFR calc non Af Amer: >60 mL/min (ref >60)
Glucose, Bld: 115 mg/dL — ABNORMAL HIGH (ref 70–99)
Potassium: 3.9 mmol/L (ref 3.5–5.1)
Sodium: 136 mmol/L (ref 135–145)
Total Bilirubin: 0.4 mg/dL (ref 0.3–1.2)
Total Protein: 6.7 g/dL (ref 6.5–8.1)

## 2020-02-02 LAB — CBC
HCT: 38.1 % (ref 36.0–46.0)
Hemoglobin: 12.7 g/dL (ref 12.0–15.0)
MCH: 29.4 pg (ref 26.0–34.0)
MCHC: 33.3 g/dL (ref 30.0–36.0)
MCV: 88.2 fL (ref 80.0–100.0)
Platelets: 244 10*3/uL (ref 150–400)
RBC: 4.32 MIL/uL (ref 3.87–5.11)
RDW: 13 % (ref 11.5–15.5)
WBC: 11.1 10*3/uL — ABNORMAL HIGH (ref 4.0–10.5)
nRBC: 0 % (ref 0.0–0.2)

## 2020-02-02 LAB — WET PREP, GENITAL
Clue Cells Wet Prep HPF POC: NONE SEEN
Sperm: NONE SEEN
Trich, Wet Prep: NONE SEEN
Yeast Wet Prep HPF POC: NONE SEEN

## 2020-02-02 LAB — PROTEIN / CREATININE RATIO, URINE
Creatinine, Urine: 295.15 mg/dL
Protein Creatinine Ratio: 0.07 mg/mg{Cre} (ref 0.00–0.15)
Total Protein, Urine: 21 mg/dL

## 2020-02-02 LAB — FETAL FIBRONECTIN: Fetal Fibronectin: NEGATIVE

## 2020-02-02 NOTE — MAU Provider Note (Signed)
CC:  Chief Complaint  Patient presents with  . Abdominal Pain     First Provider Initiated Contact with Patient 02/02/20 1416      HPI: [name] is a 34 y.o. year old [id number] female at [redacted]w[redacted]d weeks gestation who presents to MAU reporting contractions every 10 minutes since 01/29/20. Was seen in the office at [place] on 01/29/2020.  Reported contractions.  Did not have cervical exam per pt.  States she was instructed to increase fluids and take Tylenol.  Contractions have worsened since yesterday so she came in for evaluation.  Patient also noted to have elevated blood pressure.  Has history of chronic hypertension.  Was on amlodipine early in the pregnancy.  Switch to Procardia 30 mg XL daily in January 2021 and increased to 60 mg XL daily 11/22/19.  States she took her dose this morning.  BP is 120-140/80-90 in office.  Associated Sx: Negative for fever, chills, urinary complaints, GI complaints, vaginal discharge, headache, vision changes, epigastric pain. Vaginal bleeding: Denies Leaking of fluid: Denies Fetal movement: Normal   O:  Patient Vitals for the past 24 hrs:  BP Temp Temp src Pulse Resp SpO2  02/02/20 1631 (!) 143/85 - - (!) 108 18 -  02/02/20 1616 (!) 145/84 - - (!) 102 - -  02/02/20 1601 (!) 147/90 - - (!) 101 18 -  02/02/20 1546 (!) 151/101 - - (!) 104 18 -  02/02/20 1531 (!) 150/96 - - (!) 108 16 -  02/02/20 1516 (!) 143/95 - - 99 16 -  02/02/20 1501 (!) 143/97 - - (!) 109 16 -  02/02/20 1447 (!) 146/102 - - (!) 106 16 -  02/02/20 1430 (!) 139/97 - - (!) 110 16 -  02/02/20 1348 (!) 139/97 97.9 F (36.6 C) Oral (!) 112 16 99 %    General: NAD Heart: Regular rate Lungs: Normal rate and effort Abd: Soft, NT, Gravid, S=D Pelvic: NEFG, negative pooling, negative blood.  Dilation: Fingertip Effacement (%): Thick Cervical Position: Posterior Station: Ballotable Presentation: Undeterminable Exam by:: Manya Silvas, CNM  EFM: 145, Moderate  variability, 10 X 10 accelerations, mild variable decelerations Toco: Periods of contractions every 5 minutes, mild  Results for orders placed or performed during the hospital encounter of 02/02/20 (from the past 24 hour(s))  Fetal fibronectin     Status: None   Collection Time: 02/02/20  2:20 PM  Result Value Ref Range   Fetal Fibronectin NEGATIVE NEGATIVE  Wet prep, genital     Status: Abnormal   Collection Time: 02/02/20  2:20 PM   Specimen: Genital  Result Value Ref Range   Yeast Wet Prep HPF POC NONE SEEN NONE SEEN   Trich, Wet Prep NONE SEEN NONE SEEN   Clue Cells Wet Prep HPF POC NONE SEEN NONE SEEN   WBC, Wet Prep HPF POC MANY (A) NONE SEEN   Sperm NONE SEEN   Urinalysis, Routine w reflex microscopic     Status: Abnormal   Collection Time: 02/02/20  2:30 PM  Result Value Ref Range   Color, Urine AMBER (A) YELLOW   APPearance HAZY (A) CLEAR   Specific Gravity, Urine 1.029 1.005 - 1.030   pH 5.0 5.0 - 8.0   Glucose, UA NEGATIVE NEGATIVE mg/dL   Hgb urine dipstick NEGATIVE NEGATIVE   Bilirubin Urine NEGATIVE NEGATIVE   Ketones, ur NEGATIVE NEGATIVE mg/dL   Protein, ur 30 (A) NEGATIVE mg/dL   Nitrite NEGATIVE NEGATIVE   Leukocytes,Ua NEGATIVE NEGATIVE  RBC / HPF 0-5 0 - 5 RBC/hpf   WBC, UA 6-10 0 - 5 WBC/hpf   Bacteria, UA RARE (A) NONE SEEN   Squamous Epithelial / LPF 0-5 0 - 5   Mucus PRESENT    Ca Oxalate Crys, UA PRESENT   Protein / creatinine ratio, urine     Status: None   Collection Time: 02/02/20  2:30 PM  Result Value Ref Range   Creatinine, Urine 295.15 mg/dL   Total Protein, Urine 21 mg/dL   Protein Creatinine Ratio 0.07 0.00 - 0.15 mg/mg[Cre]  CBC     Status: Abnormal   Collection Time: 02/02/20  2:39 PM  Result Value Ref Range   WBC 11.1 (H) 4.0 - 10.5 K/uL   RBC 4.32 3.87 - 5.11 MIL/uL   Hemoglobin 12.7 12.0 - 15.0 g/dL   HCT 38.1 36.0 - 46.0 %   MCV 88.2 80.0 - 100.0 fL   MCH 29.4 26.0 - 34.0 pg   MCHC 33.3 30.0 - 36.0 g/dL   RDW 13.0 11.5 -  15.5 %   Platelets 244 150 - 400 K/uL   nRBC 0.0 0.0 - 0.2 %  Comprehensive metabolic panel     Status: Abnormal   Collection Time: 02/02/20  2:39 PM  Result Value Ref Range   Sodium 136 135 - 145 mmol/L   Potassium 3.9 3.5 - 5.1 mmol/L   Chloride 106 98 - 111 mmol/L   CO2 18 (L) 22 - 32 mmol/L   Glucose, Bld 115 (H) 70 - 99 mg/dL   BUN 9 6 - 20 mg/dL   Creatinine, Ser 0.62 0.44 - 1.00 mg/dL   Calcium 9.6 8.9 - 10.3 mg/dL   Total Protein 6.7 6.5 - 8.1 g/dL   Albumin 2.6 (L) 3.5 - 5.0 g/dL   AST 20 15 - 41 U/L   ALT 24 0 - 44 U/L   Alkaline Phosphatase 167 (H) 38 - 126 U/L   Total Bilirubin 0.4 0.3 - 1.2 mg/dL   GFR calc non Af Amer >60 >60 mL/min   GFR calc Af Amer >60 >60 mL/min   Anion gap 12 5 - 15   MAU Course  Orders Placed This Encounter  Procedures  . Wet prep, genital  . Urinalysis, Routine w reflex microscopic  . Fetal fibronectin  . CBC  . Comprehensive metabolic panel  . Protein / creatinine ratio, urine  . Vital signs  . Discharge patient   Discussed history, physical, labs, blood pressures with Dr. Kennon Rounds.  Although blood pressures are consistently elevated today they are not a significant enough to change from those in the office to warrant immediate change in plan.  Patient is to appropriate for follow-up in office as scheduled 02/05/2020.  Dr. Ricardo Jericho notofied of BPs. Agrees w/plan for F/U in 3 days.  MDM - Preterm contractions without evidence of active preterm labor.  Cervix multiparous-feeling but closed.  Fetal fibronectin negative.  -Chronic hypertension with exacerbation in second trimester.  No evidence of preeclampsia.  Preeclampsia precautions reviewed.  A: 101w5d week IUP 1. Preterm uterine contractions   2. Chronic hypertension with exacerbation during pregnancy in second trimester   FHR reactive  P: Discharge home in stable condition per consult with Dr. Kennon Rounds Preterm labor precautions and fetal kick counts. Follow-up as scheduled for  prenatal visit or sooner as needed if symptoms worsen. Return to maternity admissions as needed if symptoms worsen.  Tamala Julian, [place], [place] 02/02/2020 5:17 PM  3

## 2020-02-02 NOTE — Discharge Instructions (Signed)
Preterm Labor and Birth Information ° °The normal length of a pregnancy is 39-41 weeks. Preterm labor is when labor starts before 37 completed weeks of pregnancy. °What are the risk factors for preterm labor? °Preterm labor is more likely to occur in women who: °· Have certain infections during pregnancy such as a bladder infection, sexually transmitted infection, or infection inside the uterus (chorioamnionitis). °· Have a shorter-than-normal cervix. °· Have gone into preterm labor before. °· Have had surgery on their cervix. °· Are younger than age 17 or older than age 35. °· Are African American. °· Are pregnant with twins or multiple babies (multiple gestation). °· Take street drugs or smoke while pregnant. °· Do not gain enough weight while pregnant. °· Became pregnant shortly after having been pregnant. °What are the symptoms of preterm labor? °Symptoms of preterm labor include: °· Cramps similar to those that can happen during a menstrual period. The cramps may happen with diarrhea. °· Pain in the abdomen or lower back. °· Regular uterine contractions that may feel like tightening of the abdomen. °· A feeling of increased pressure in the pelvis. °· Increased watery or bloody mucus discharge from the vagina. °· Water breaking (ruptured amniotic sac). °Why is it important to recognize signs of preterm labor? °It is important to recognize signs of preterm labor because babies who are born prematurely may not be fully developed. This can put them at an increased risk for: °· Long-term (chronic) heart and lung problems. °· Difficulty immediately after birth with regulating body systems, including blood sugar, body temperature, heart rate, and breathing rate. °· Bleeding in the brain. °· Cerebral palsy. °· Learning difficulties. °· Death. °These risks are highest for babies who are born before 34 weeks of pregnancy. °How is preterm labor treated? °Treatment depends on the length of your pregnancy, your condition,  and the health of your baby. It may involve: °· Having a stitch (suture) placed in your cervix to prevent your cervix from opening too early (cerclage). °· Taking or being given medicines, such as: °? Hormone medicines. These may be given early in pregnancy to help support the pregnancy. °? Medicine to stop contractions. °? Medicines to help mature the baby’s lungs. These may be prescribed if the risk of delivery is high. °? Medicines to prevent your baby from developing cerebral palsy. °If the labor happens before 34 weeks of pregnancy, you may need to stay in the hospital. °What should I do if I think I am in preterm labor? °If you think that you are going into preterm labor, call your health care provider right away. °How can I prevent preterm labor in future pregnancies? °To increase your chance of having a full-term pregnancy: °· Do not use any tobacco products, such as cigarettes, chewing tobacco, and e-cigarettes. If you need help quitting, ask your health care provider. °· Do not use street drugs or medicines that have not been prescribed to you during your pregnancy. °· Talk with your health care provider before taking any herbal supplements, even if you have been taking them regularly. °· Make sure you gain a healthy amount of weight during your pregnancy. °· Watch for infection. If you think that you might have an infection, get it checked right away. °· Make sure to tell your health care provider if you have gone into preterm labor before. °This information is not intended to replace advice given to you by your health care provider. Make sure you discuss any questions you have with your   health care provider. Document Revised: 02/09/2019 Document Reviewed: 03/10/2016 Elsevier Patient Education  [place].    Hypertension During Pregnancy High blood pressure (hypertension) is when the force of blood pumping through the arteries is too strong. Arteries are blood vessels that carry blood from  the heart throughout the body. Hypertension during pregnancy can be mild or severe. Severe hypertension during pregnancy (preeclampsia) is a medical emergency that requires prompt evaluation and treatment. Different types of hypertension can happen during pregnancy. These include:  Chronic hypertension. This happens when you had high blood pressure before you became pregnant, and it continues during the pregnancy. Hypertension that develops before you are [redacted] weeks pregnant and continues during the pregnancy is also called chronic hypertension. If you have chronic hypertension, it will not go away after you have your baby. You will need follow-up visits with your health care provider after you have your baby. Your doctor may want you to keep taking medicine for your blood pressure.  Gestational hypertension. This is hypertension that develops after the 20th week of pregnancy. Gestational hypertension usually goes away after you have your baby, but your health care provider will need to monitor your blood pressure to make sure that it is getting better.  Preeclampsia. This is severe hypertension during pregnancy. This can cause serious complications for you and your baby and can also cause complications for you after the delivery of your baby.  Postpartum preeclampsia. You may develop severe hypertension after giving birth. This usually occurs within 48 hours after childbirth but may occur up to 6 weeks after giving birth. This is rare. How does this affect me? Women who have hypertension during pregnancy have a greater chance of developing hypertension later in life or during future pregnancies. In some cases, hypertension during pregnancy can cause serious complications, such as:  Stroke.  Heart attack.  Injury to other organs, such as kidneys, lungs, or liver.  Preeclampsia.  Convulsions or seizures.  Placental abruption. How does this affect my baby? Hypertension during pregnancy can  affect your baby. Your baby may:  Be born early (prematurely).  Not weigh as much as he or she should at birth (low birth weight).  Not tolerate labor well, leading to an unplanned cesarean delivery. What are the risks? There are certain factors that make it more likely for you to develop hypertension during pregnancy. These include:  Having hypertension during a previous pregnancy.  Being overweight.  Being age 25 or older.  Being pregnant for the first time.  Being pregnant with more than one baby.  Becoming pregnant using fertilization methods, such as IVF (in vitro fertilization).  Having other medical problems, such as diabetes, kidney disease, or lupus.  Having a family history of hypertension. What can I do to lower my risk? The exact cause of hypertension during pregnancy is not known. You may be able to lower your risk by:  Maintaining a healthy weight.  Eating a healthy and balanced diet.  Following your health care provider's instructions about treating any long-term conditions that you had before becoming pregnant. It is very important to keep all of your prenatal care appointments. Your health care provider will check your blood pressure and make sure that your pregnancy is progressing as expected. If a problem is found, early treatment can prevent complications. How is this treated? Treatment for hypertension during pregnancy varies depending on the type of hypertension you have and how serious it is.  If you were taking medicine for high blood  pressure before you became pregnant, talk with your health care provider. You may need to change medicine during pregnancy because some medicines, like ACE inhibitors, may not be considered safe for your baby.  If you have gestational hypertension, your health care provider may order medicine to treat this during pregnancy.  If you are at risk for preeclampsia, your health care provider may recommend that you take a  low-dose aspirin during your pregnancy.  If you have severe hypertension, you may need to be hospitalized so you and your baby can be monitored closely. You may also need to be given medicine to lower your blood pressure. This medicine may be given by mouth or through an IV.  In some cases, if your condition gets worse, you may need to deliver your baby early. Follow these instructions at home: Eating and drinking   Drink enough fluid to keep your urine pale yellow.  Avoid caffeine. Lifestyle  Do not use any products that contain nicotine or tobacco, such as cigarettes, e-cigarettes, and chewing tobacco. If you need help quitting, ask your health care provider.  Do not use alcohol or drugs.  Avoid stress as much as possible.  Rest and get plenty of sleep.  Regular exercise can help to reduce your blood pressure. Ask your health care provider what kinds of exercise are best for you. General instructions  Take over-the-counter and prescription medicines only as told by your health care provider.  Keep all prenatal and follow-up visits as told by your health care provider. This is important. Contact a health care provider if:  You have symptoms that your health care provider told you may require more treatment or monitoring, such as: ? Headaches. ? Nausea or vomiting. ? Abdominal pain. ? Dizziness. ? Light-headedness. Get help right away if:  You have: ? Severe abdominal pain that does not get better with treatment. ? A severe headache that does not get better. ? Vomiting that does not get better. ? Sudden, rapid weight gain. ? Sudden swelling in your hands, ankles, or face. ? Vaginal bleeding. ? Blood in your urine. ? Blurred or double vision. ? Shortness of breath or chest pain. ? Weakness on one side of your body. ? Difficulty speaking.  Your baby is not moving as much as usual. Summary  High blood pressure (hypertension) is when the force of blood pumping  through the arteries is too strong.  Hypertension during pregnancy can cause problems for you and your baby.  Treatment for hypertension during pregnancy varies depending on the type of hypertension you have and how serious it is.  Keep all prenatal and follow-up visits as told by your health care provider. This is important. This information is not intended to replace advice given to you by your health care provider. Make sure you discuss any questions you have with your health care provider. Document Revised: 02/08/2019 Document Reviewed: 11/14/2018 Elsevier Patient Education  [place].

## 2020-02-02 NOTE — MAU Note (Signed)
[name] is a 34 y.o. at [redacted]w[redacted]d here in MAU reporting: started having sharp pain on Tuesday. Went to the doctor and they did a NST and didn't see any contractions. Tried water and tylenol but pains haven't gone away. States the pain comes like contractions but they are sharp. No bleeding or LOF. +FM  Onset of complaint: ongoing  Pain score: 8/10  Vitals:   02/02/20 1348  BP: (!) 139/97  Pulse: (!) 112  Resp: 16  Temp: 97.9 F (36.6 C)  SpO2: 99%     FHT: +FM  Lab orders placed from triage: UA, unable to give sample at this time

## 2020-02-04 LAB — GC/CHLAMYDIA PROBE AMP (~~LOC~~) NOT AT ARMC
Chlamydia: NEGATIVE
Comment: NEGATIVE
Comment: NORMAL
Neisseria Gonorrhea: NEGATIVE

## 2020-02-05 DIAGNOSIS — Z3689 Encounter for other specified antenatal screening: Secondary | ICD-10-CM | POA: Diagnosis not present

## 2020-02-05 DIAGNOSIS — Z23 Encounter for immunization: Secondary | ICD-10-CM | POA: Diagnosis not present

## 2020-02-08 DIAGNOSIS — R7309 Other abnormal glucose: Secondary | ICD-10-CM | POA: Diagnosis not present

## 2020-02-13 ENCOUNTER — Other Ambulatory Visit: Payer: Self-pay

## 2020-02-13 ENCOUNTER — Encounter: Payer: BC Managed Care – PPO | Attending: Obstetrics and Gynecology | Admitting: Registered"

## 2020-02-13 DIAGNOSIS — O99810 Abnormal glucose complicating pregnancy: Secondary | ICD-10-CM

## 2020-02-14 ENCOUNTER — Encounter: Payer: Self-pay | Admitting: Registered"

## 2020-02-14 DIAGNOSIS — O99810 Abnormal glucose complicating pregnancy: Secondary | ICD-10-CM | POA: Insufficient documentation

## 2020-02-14 NOTE — Progress Notes (Signed)
Patient was seen on 02/13/20 for Gestational Diabetes self-management class at the [facility]. The following learning objectives were met by the patient during this course:   States the definition of Gestational Diabetes  States why dietary management is important in controlling blood glucose  Describes the effects each nutrient has on blood glucose levels  Demonstrates ability to create a balanced meal plan  Demonstrates carbohydrate counting   States when to check blood glucose levels  Demonstrates proper blood glucose monitoring techniques  States the effect of stress and exercise on blood glucose levels  States the importance of limiting caffeine and abstaining from alcohol and smoking  Blood glucose monitor given: patient brought One [employer] to class Blood glucose reading: 102 mg/dL  Patient instructed to monitor glucose levels: FBS: 60 - <95; 1 hour: <140; 2 hour: <120  Patient received handouts:  Nutrition Diabetes and Pregnancy, including carb counting list  Patient will be seen for follow-up as needed.

## 2020-02-23 IMAGING — DX DG FINGER MIDDLE 2+V*R*
3 series · 3 of 3 positions shown · non-contrast
Comparison: None.

CLINICAL DATA: Right middle finger injury at work.

EXAM:
RIGHT MIDDLE FINGER 2+V

[finger ap]
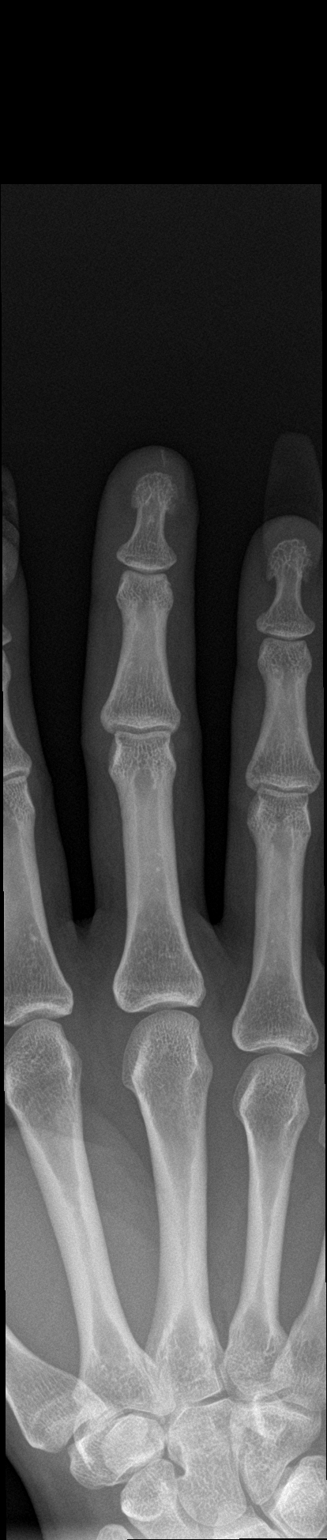

[finger obl]
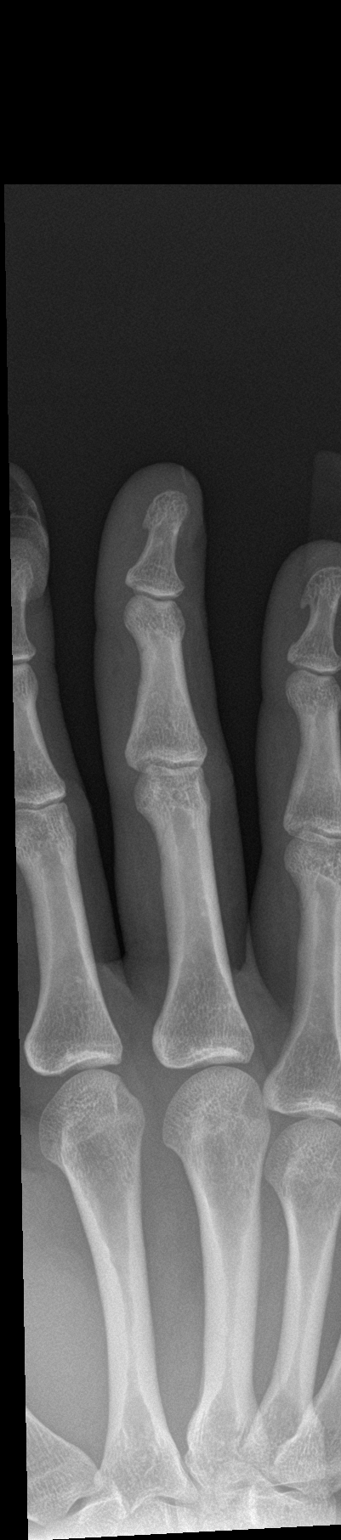

[finger lat]
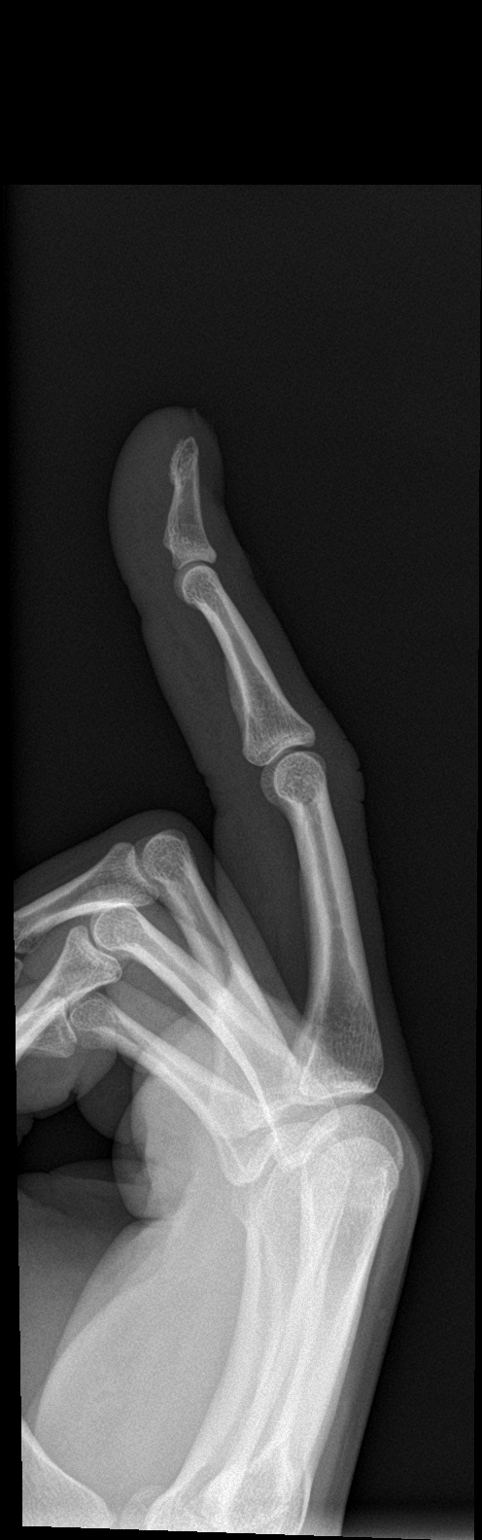

[3 of 3 positions shown; findings below may reference images not displayed]

FINDINGS: There is no evidence of fracture or dislocation. There is no
evidence of arthropathy or other focal bone abnormality. Soft
tissues are unremarkable.
IMPRESSION: Normal right middle finger.

## 2020-03-10 DIAGNOSIS — Z3A32 32 weeks gestation of pregnancy: Secondary | ICD-10-CM | POA: Diagnosis not present

## 2020-03-10 DIAGNOSIS — O24419 Gestational diabetes mellitus in pregnancy, unspecified control: Secondary | ICD-10-CM | POA: Diagnosis not present

## 2020-03-10 DIAGNOSIS — O133 Gestational [pregnancy-induced] hypertension without significant proteinuria, third trimester: Secondary | ICD-10-CM | POA: Diagnosis not present

## 2020-03-12 ENCOUNTER — Other Ambulatory Visit: Payer: Self-pay | Admitting: Obstetrics and Gynecology

## 2020-03-12 DIAGNOSIS — Z3A34 34 weeks gestation of pregnancy: Secondary | ICD-10-CM

## 2020-03-12 DIAGNOSIS — O283 Abnormal ultrasonic finding on antenatal screening of mother: Secondary | ICD-10-CM

## 2020-03-12 DIAGNOSIS — Z363 Encounter for antenatal screening for malformations: Secondary | ICD-10-CM

## 2020-03-12 DIAGNOSIS — O43122 Velamentous insertion of umbilical cord, second trimester: Secondary | ICD-10-CM

## 2020-03-17 ENCOUNTER — Encounter (HOSPITAL_COMMUNITY): Payer: Self-pay | Admitting: Obstetrics and Gynecology

## 2020-03-17 ENCOUNTER — Inpatient Hospital Stay (HOSPITAL_COMMUNITY)
Admission: AD | Admit: 2020-03-17 | Discharge: 2020-03-17 | Disposition: A | Discharge status: Home / Self Care | Payer: BC Managed Care – PPO | Attending: Obstetrics and Gynecology | Admitting: Obstetrics and Gynecology

## 2020-03-17 ENCOUNTER — Other Ambulatory Visit: Payer: Self-pay

## 2020-03-17 DIAGNOSIS — Z8751 Personal history of pre-term labor: Secondary | ICD-10-CM | POA: Diagnosis not present

## 2020-03-17 DIAGNOSIS — Z3A33 33 weeks gestation of pregnancy: Secondary | ICD-10-CM | POA: Diagnosis not present

## 2020-03-17 DIAGNOSIS — O10013 Pre-existing essential hypertension complicating pregnancy, third trimester: Secondary | ICD-10-CM | POA: Diagnosis not present

## 2020-03-17 DIAGNOSIS — O10913 Unspecified pre-existing hypertension complicating pregnancy, third trimester: Secondary | ICD-10-CM | POA: Insufficient documentation

## 2020-03-17 LAB — COMPREHENSIVE METABOLIC PANEL
ALT: 19 U/L (ref 0–44)
AST: 28 U/L (ref 15–41)
Albumin: 2.7 g/dL — ABNORMAL LOW (ref 3.5–5.0)
Alkaline Phosphatase: 240 U/L — ABNORMAL HIGH (ref 38–126)
Anion gap: 9 (ref 5–15)
BUN: 8 mg/dL (ref 6–20)
CO2: 19 mmol/L — ABNORMAL LOW (ref 22–32)
Calcium: 9.7 mg/dL (ref 8.9–10.3)
Chloride: 109 mmol/L (ref 98–111)
Creatinine, Ser: 0.59 mg/dL (ref 0.44–1.00)
GFR calc Af Amer: >60 mL/min (ref >60)
GFR calc non Af Amer: >60 mL/min (ref >60)
Glucose, Bld: 75 mg/dL (ref 70–99)
Potassium: 4.2 mmol/L (ref 3.5–5.1)
Sodium: 137 mmol/L (ref 135–145)
Total Bilirubin: 0.5 mg/dL (ref 0.3–1.2)
Total Protein: 6.5 g/dL (ref 6.5–8.1)

## 2020-03-17 LAB — URINALYSIS, ROUTINE W REFLEX MICROSCOPIC
Bilirubin Urine: NEGATIVE
Glucose, UA: NEGATIVE mg/dL
Hgb urine dipstick: NEGATIVE
Ketones, ur: NEGATIVE mg/dL
Leukocytes,Ua: NEGATIVE
Nitrite: NEGATIVE
Protein, ur: 30 mg/dL — AB
Specific Gravity, Urine: 1.019 (ref 1.005–1.030)
pH: 5 (ref 5.0–8.0)

## 2020-03-17 LAB — CBC
HCT: 34.8 % — ABNORMAL LOW (ref 36.0–46.0)
Hemoglobin: 11.6 g/dL — ABNORMAL LOW (ref 12.0–15.0)
MCH: 29.7 pg (ref 26.0–34.0)
MCHC: 33.3 g/dL (ref 30.0–36.0)
MCV: 89 fL (ref 80.0–100.0)
Platelets: 213 10*3/uL (ref 150–400)
RBC: 3.91 MIL/uL (ref 3.87–5.11)
RDW: 13.2 % (ref 11.5–15.5)
WBC: 10 10*3/uL (ref 4.0–10.5)
nRBC: 0 % (ref 0.0–0.2)

## 2020-03-17 LAB — PROTEIN / CREATININE RATIO, URINE
Creatinine, Urine: 122.29 mg/dL
Protein Creatinine Ratio: 0.21 mg/mg{Cre} — ABNORMAL HIGH (ref 0.00–0.15)
Total Protein, Urine: 26 mg/dL

## 2020-03-17 MED ORDER — LABETALOL HCL 5 MG/ML IV SOLN: 80.0000 mg | INTRAVENOUS | Status: DC | PRN

## 2020-03-17 MED ORDER — NIFEDIPINE ER 90 MG PO TB24: 90.0000 mg | ORAL_TABLET | Freq: Every day | ORAL | 0 refills | Status: AC

## 2020-03-17 MED ORDER — LABETALOL HCL 5 MG/ML IV SOLN
20.0000 mg | INTRAVENOUS | Status: DC | PRN
  Administered 2020-03-17: 20 mg via INTRAVENOUS
  Filled 2020-03-17: qty 4

## 2020-03-17 MED ORDER — LABETALOL HCL 5 MG/ML IV SOLN: 40.0000 mg | INTRAVENOUS | Status: DC | PRN

## 2020-03-17 MED ORDER — HYDRALAZINE HCL 20 MG/ML IJ SOLN: 10.0000 mg | INTRAMUSCULAR | Status: DC | PRN

## 2020-03-17 NOTE — MAU Note (Signed)
[name] is a 34 y.o. at [redacted]w[redacted]d here in MAU reporting: sent over from the office for elevated BP. States it was 171/100 and had protein in her urine. "Feels strange". Having intermittent headache and visual changes but has neither of those symptoms currently. No pain. No VB or LOF. +FM  Onset of complaint: today  Pain score: 0/10  Vitals:   03/17/20 1613  BP: (!) 162/107  Pulse: 96  Resp: 16  Temp: 98.6 F (37 C)  SpO2: 98%     FHT: +FM  Lab orders placed from triage: UA

## 2020-03-17 NOTE — MAU Provider Note (Signed)
Chief Complaint  Patient presents with  . Hypertension     First Provider Initiated Contact with Patient 03/17/20 1641      S: [name]  is a 34 y.o. y.o. year old [phone] female at [redacted]w[redacted]d weeks gestation who presents to MAU with elevated blood pressures. Hx of chronic hypertension & preeclampsia in her first pregnancy which resulted in a 33 week delivery. Current blood pressure medication: procardia xl 60 mg. Last dose was this morning. States her BPs have been higher than normal at home, up to 170/100. Has been having more headache recently & occasionally floaters in her vision when standing too long. Denies headache or visual disturbance today.  Denies epigastric pain.  Denies any other OB complaints.  Good fetal movement.  Goes to [place] and has an appointment this Thursday.   O:  Patient Vitals for the past 24 hrs:  BP Temp Temp src Pulse Resp SpO2 Height Weight  03/17/20 1746 (!) 150/97 -- -- 80 -- -- -- --  03/17/20 1731 (!) 142/97 -- -- 87 -- -- -- --  03/17/20 1716 (!) 157/96 -- -- 85 -- -- -- --  03/17/20 1701 (!) 160/104 -- -- 91 -- -- -- --  03/17/20 1646 (!) 158/102 -- -- 94 -- -- -- --  03/17/20 1631 (!) 156/99 -- -- 100 -- -- -- --  03/17/20 1613 (!) 162/107 98.6 F (37 C) Oral 96 16 98 % -- --  03/17/20 1609 -- -- -- -- -- -- 5\' 7"  (1.702 m) 105 kg   General: NAD Heart: Regular rate Lungs: Normal rate and effort Abd: Soft, NT, Gravid, S=D Extremities: non pitting Pedal edema Neuro: 2+ deep tendon reflexes, No clonus  NST:  Baseline: 130 bpm, Variability: Good {> 6 bpm), Accelerations: Reactive and Decelerations: Absent  Results for orders placed or performed during the hospital encounter of 03/17/20 (from the past 24 hour(s))  Urinalysis, Routine w reflex microscopic     Status: Abnormal   Collection Time: 03/17/20  4:22 PM  Result Value Ref Range   Color, Urine YELLOW YELLOW   APPearance HAZY (A) CLEAR   Specific Gravity, Urine 1.019 1.005 -  1.030   pH 5.0 5.0 - 8.0   Glucose, UA NEGATIVE NEGATIVE mg/dL   Hgb urine dipstick NEGATIVE NEGATIVE   Bilirubin Urine NEGATIVE NEGATIVE   Ketones, ur NEGATIVE NEGATIVE mg/dL   Protein, ur 30 (A) NEGATIVE mg/dL   Nitrite NEGATIVE NEGATIVE   Leukocytes,Ua NEGATIVE NEGATIVE   RBC / HPF 0-5 0 - 5 RBC/hpf   WBC, UA 0-5 0 - 5 WBC/hpf   Bacteria, UA FEW (A) NONE SEEN   Squamous Epithelial / LPF 0-5 0 - 5   Mucus PRESENT   Protein / creatinine ratio, urine     Status: Abnormal   Collection Time: 03/17/20  4:22 PM  Result Value Ref Range   Creatinine, Urine 122.29 mg/dL   Total Protein, Urine 26 mg/dL   Protein Creatinine Ratio 0.21 (H) 0.00 - 0.15 mg/mg[Cre]  CBC     Status: Abnormal   Collection Time: 03/17/20  4:37 PM  Result Value Ref Range   WBC 10.0 4.0 - 10.5 K/uL   RBC 3.91 3.87 - 5.11 MIL/uL   Hemoglobin 11.6 (L) 12.0 - 15.0 g/dL   HCT 34.8 (L) 36.0 - 46.0 %   MCV 89.0 80.0 - 100.0 fL   MCH 29.7 26.0 - 34.0 pg   MCHC 33.3 30.0 - 36.0 g/dL  RDW 13.2 11.5 - 15.5 %   Platelets 213 150 - 400 K/uL   nRBC 0.0 0.0 - 0.2 %  Comprehensive metabolic panel     Status: Abnormal   Collection Time: 03/17/20  4:37 PM  Result Value Ref Range   Sodium 137 135 - 145 mmol/L   Potassium 4.2 3.5 - 5.1 mmol/L   Chloride 109 98 - 111 mmol/L   CO2 19 (L) 22 - 32 mmol/L   Glucose, Bld 75 70 - 99 mg/dL   BUN 8 6 - 20 mg/dL   Creatinine, Ser 0.59 0.44 - 1.00 mg/dL   Calcium 9.7 8.9 - 10.3 mg/dL   Total Protein 6.5 6.5 - 8.1 g/dL   Albumin 2.7 (L) 3.5 - 5.0 g/dL   AST 28 15 - 41 U/L   ALT 19 0 - 44 U/L   Alkaline Phosphatase 240 (H) 38 - 126 U/L   Total Bilirubin 0.5 0.3 - 1.2 mg/dL   GFR calc non Af Amer >60 >60 mL/min   GFR calc Af Amer >60 >60 mL/min   Anion gap 9 5 - 15    MDM Elevated BPs with 2 severe range BPs. Given IV labetalol 20 mg x 1 dose. No further severe BPs. Patient completely asymptomatic in MAU. PEC labs normal.   Reviewed with Dr. Ilda Basset. Ok to discharge home &  increased procardia to 90 mg daily, pt to start tomorrow morning.  Patient has appointment in the office this Thursday.   A:  1. Chronic hypertension complicating or reason for care during pregnancy, third trimester   2. [redacted] weeks gestation of pregnancy     P:  Discharge home in stable condition per consult with Dr. Ilda Basset Rx procardia xl 90 mg daily Preeclampsia precautions. Return to maternity admissions as needed in emergencies  Jorje Guild, NP 03/17/2020 6:39 PM

## 2020-03-17 NOTE — Discharge Instructions (Signed)

## 2020-03-20 DIAGNOSIS — O133 Gestational [pregnancy-induced] hypertension without significant proteinuria, third trimester: Secondary | ICD-10-CM | POA: Diagnosis not present

## 2020-03-20 DIAGNOSIS — Z3A33 33 weeks gestation of pregnancy: Secondary | ICD-10-CM | POA: Diagnosis not present

## 2020-03-24 ENCOUNTER — Inpatient Hospital Stay (HOSPITAL_COMMUNITY)
Admission: AD | Admit: 2020-03-24 | Discharge: 2020-03-30 | DRG: 784 | Disposition: A | Discharge status: Home / Self Care | Payer: BC Managed Care – PPO | Attending: Obstetrics and Gynecology | Admitting: Obstetrics and Gynecology

## 2020-03-24 ENCOUNTER — Encounter (HOSPITAL_COMMUNITY): Payer: Self-pay | Admitting: Obstetrics and Gynecology

## 2020-03-24 ENCOUNTER — Other Ambulatory Visit: Payer: Self-pay

## 2020-03-24 DIAGNOSIS — O1002 Pre-existing essential hypertension complicating childbirth: Secondary | ICD-10-CM | POA: Diagnosis present

## 2020-03-24 DIAGNOSIS — O3413 Maternal care for benign tumor of corpus uteri, third trimester: Secondary | ICD-10-CM | POA: Diagnosis present

## 2020-03-24 DIAGNOSIS — O114 Pre-existing hypertension with pre-eclampsia, complicating childbirth: Secondary | ICD-10-CM | POA: Diagnosis not present

## 2020-03-24 DIAGNOSIS — Z3A00 Weeks of gestation of pregnancy not specified: Secondary | ICD-10-CM | POA: Diagnosis not present

## 2020-03-24 DIAGNOSIS — Z3A34 34 weeks gestation of pregnancy: Secondary | ICD-10-CM

## 2020-03-24 DIAGNOSIS — O24420 Gestational diabetes mellitus in childbirth, diet controlled: Secondary | ICD-10-CM | POA: Diagnosis not present

## 2020-03-24 DIAGNOSIS — O133 Gestational [pregnancy-induced] hypertension without significant proteinuria, third trimester: Secondary | ICD-10-CM | POA: Diagnosis present

## 2020-03-24 DIAGNOSIS — Z302 Encounter for sterilization: Secondary | ICD-10-CM | POA: Diagnosis not present

## 2020-03-24 DIAGNOSIS — Z141 Cystic fibrosis carrier: Secondary | ICD-10-CM | POA: Diagnosis not present

## 2020-03-24 DIAGNOSIS — O99214 Obesity complicating childbirth: Secondary | ICD-10-CM | POA: Diagnosis not present

## 2020-03-24 DIAGNOSIS — Z20822 Contact with and (suspected) exposure to covid-19: Secondary | ICD-10-CM | POA: Diagnosis present

## 2020-03-24 DIAGNOSIS — O43123 Velamentous insertion of umbilical cord, third trimester: Secondary | ICD-10-CM | POA: Diagnosis not present

## 2020-03-24 DIAGNOSIS — O119 Pre-existing hypertension with pre-eclampsia, unspecified trimester: Secondary | ICD-10-CM | POA: Diagnosis present

## 2020-03-24 DIAGNOSIS — O10913 Unspecified pre-existing hypertension complicating pregnancy, third trimester: Secondary | ICD-10-CM

## 2020-03-24 DIAGNOSIS — E669 Obesity, unspecified: Secondary | ICD-10-CM | POA: Diagnosis present

## 2020-03-24 DIAGNOSIS — Z98891 History of uterine scar from previous surgery: Secondary | ICD-10-CM

## 2020-03-24 DIAGNOSIS — D252 Subserosal leiomyoma of uterus: Secondary | ICD-10-CM | POA: Diagnosis not present

## 2020-03-24 DIAGNOSIS — D62 Acute posthemorrhagic anemia: Secondary | ICD-10-CM | POA: Diagnosis not present

## 2020-03-24 DIAGNOSIS — O24429 Gestational diabetes mellitus in childbirth, unspecified control: Secondary | ICD-10-CM | POA: Diagnosis not present

## 2020-03-24 DIAGNOSIS — O113 Pre-existing hypertension with pre-eclampsia, third trimester: Secondary | ICD-10-CM

## 2020-03-24 DIAGNOSIS — O9081 Anemia of the puerperium: Secondary | ICD-10-CM | POA: Diagnosis not present

## 2020-03-24 DIAGNOSIS — Z363 Encounter for antenatal screening for malformations: Secondary | ICD-10-CM | POA: Diagnosis not present

## 2020-03-24 LAB — TYPE AND SCREEN
ABO/RH(D): A POS
Antibody Screen: NEGATIVE

## 2020-03-24 LAB — COMPREHENSIVE METABOLIC PANEL
ALT: 18 U/L (ref 0–44)
AST: 26 U/L (ref 15–41)
Albumin: 2.4 g/dL — ABNORMAL LOW (ref 3.5–5.0)
Alkaline Phosphatase: 237 U/L — ABNORMAL HIGH (ref 38–126)
Anion gap: 11 (ref 5–15)
BUN: 8 mg/dL (ref 6–20)
CO2: 16 mmol/L — ABNORMAL LOW (ref 22–32)
Calcium: 8.9 mg/dL (ref 8.9–10.3)
Chloride: 109 mmol/L (ref 98–111)
Creatinine, Ser: 0.66 mg/dL (ref 0.44–1.00)
GFR calc Af Amer: >60 mL/min (ref >60)
GFR calc non Af Amer: >60 mL/min (ref >60)
Glucose, Bld: 100 mg/dL — ABNORMAL HIGH (ref 70–99)
Potassium: 4 mmol/L (ref 3.5–5.1)
Sodium: 136 mmol/L (ref 135–145)
Total Bilirubin: 0.3 mg/dL (ref 0.3–1.2)
Total Protein: 5.7 g/dL — ABNORMAL LOW (ref 6.5–8.1)

## 2020-03-24 LAB — PROTEIN / CREATININE RATIO, URINE
Creatinine, Urine: 204.28 mg/dL
Protein Creatinine Ratio: 0.16 mg/mg{Cre} — ABNORMAL HIGH (ref 0.00–0.15)
Total Protein, Urine: 33 mg/dL

## 2020-03-24 LAB — CBC
HCT: 32 % — ABNORMAL LOW (ref 36.0–46.0)
Hemoglobin: 10.7 g/dL — ABNORMAL LOW (ref 12.0–15.0)
MCH: 29.9 pg (ref 26.0–34.0)
MCHC: 33.4 g/dL (ref 30.0–36.0)
MCV: 89.4 fL (ref 80.0–100.0)
Platelets: 189 10*3/uL (ref 150–400)
RBC: 3.58 MIL/uL — ABNORMAL LOW (ref 3.87–5.11)
RDW: 13.2 % (ref 11.5–15.5)
WBC: 8.8 10*3/uL (ref 4.0–10.5)
nRBC: 0 % (ref 0.0–0.2)

## 2020-03-24 LAB — URINALYSIS, ROUTINE W REFLEX MICROSCOPIC
Bilirubin Urine: NEGATIVE
Glucose, UA: NEGATIVE mg/dL
Hgb urine dipstick: NEGATIVE
Ketones, ur: NEGATIVE mg/dL
Leukocytes,Ua: NEGATIVE
Nitrite: NEGATIVE
Protein, ur: 30 mg/dL — AB
Specific Gravity, Urine: 1.027 (ref 1.005–1.030)
pH: 5 (ref 5.0–8.0)

## 2020-03-24 LAB — ABO/RH: ABO/RH(D): A POS

## 2020-03-24 LAB — GLUCOSE, CAPILLARY: Glucose-Capillary: 141 mg/dL — ABNORMAL HIGH (ref 70–99)

## 2020-03-24 MED ORDER — DOCUSATE SODIUM 100 MG PO CAPS: 100.0000 mg | ORAL_CAPSULE | Freq: Every day | ORAL | Status: DC

## 2020-03-24 MED ORDER — BETAMETHASONE SOD PHOS & ACET 6 (3-3) MG/ML IJ SUSP
12.0000 mg | Freq: Once | INTRAMUSCULAR | Status: AC
  Administered 2020-03-25: 12 mg via INTRAMUSCULAR

## 2020-03-24 MED ORDER — BETAMETHASONE SOD PHOS & ACET 6 (3-3) MG/ML IJ SUSP: 12.0000 mg | INTRAMUSCULAR | Status: DC

## 2020-03-24 MED ORDER — PANTOPRAZOLE SODIUM 40 MG PO TBEC
40.0000 mg | DELAYED_RELEASE_TABLET | Freq: Every day | ORAL | Status: DC
  Administered 2020-03-25 – 2020-03-26 (×2): 40 mg via ORAL
  Filled 2020-03-24 (×2): qty 1

## 2020-03-24 MED ORDER — SODIUM CHLORIDE 0.9% FLUSH: 3.0000 mL | INTRAVENOUS | Status: DC | PRN

## 2020-03-24 MED ORDER — LABETALOL HCL 200 MG PO TABS
200.0000 mg | ORAL_TABLET | Freq: Two times a day (BID) | ORAL | Status: DC
  Administered 2020-03-24 – 2020-03-30 (×12): 200 mg via ORAL
  Filled 2020-03-24 (×14): qty 1

## 2020-03-24 MED ORDER — LABETALOL HCL 5 MG/ML IV SOLN
40.0000 mg | INTRAVENOUS | Status: DC | PRN
  Administered 2020-03-24: 40 mg via INTRAVENOUS
  Filled 2020-03-24: qty 8

## 2020-03-24 MED ORDER — SODIUM CHLORIDE 0.9% FLUSH
3.0000 mL | Freq: Two times a day (BID) | INTRAVENOUS | Status: DC
  Administered 2020-03-24 – 2020-03-26 (×4): 3 mL via INTRAVENOUS

## 2020-03-24 MED ORDER — HYDRALAZINE HCL 20 MG/ML IJ SOLN: 10.0000 mg | INTRAMUSCULAR | Status: DC | PRN

## 2020-03-24 MED ORDER — LABETALOL HCL 5 MG/ML IV SOLN
20.0000 mg | INTRAVENOUS | Status: DC | PRN
  Administered 2020-03-24: 20 mg via INTRAVENOUS
  Filled 2020-03-24: qty 4

## 2020-03-24 MED ORDER — LABETALOL HCL 5 MG/ML IV SOLN: 80.0000 mg | INTRAVENOUS | Status: DC | PRN

## 2020-03-24 MED ORDER — NIFEDIPINE ER OSMOTIC RELEASE 30 MG PO TB24: 90.0000 mg | ORAL_TABLET | Freq: Every day | ORAL | Status: DC

## 2020-03-24 MED ORDER — PRENATAL MULTIVITAMIN CH
1.0000 | ORAL_TABLET | Freq: Every day | ORAL | Status: DC
  Administered 2020-03-25 – 2020-03-26 (×2): 1 via ORAL
  Filled 2020-03-24 (×2): qty 1

## 2020-03-24 MED ORDER — BETAMETHASONE SOD PHOS & ACET 6 (3-3) MG/ML IJ SUSP
12.0000 mg | Freq: Once | INTRAMUSCULAR | Status: AC
  Administered 2020-03-24: 12 mg via INTRAMUSCULAR
  Filled 2020-03-24: qty 5

## 2020-03-24 MED ORDER — SODIUM CHLORIDE 0.9 % IV SOLN: 250.0000 mL | INTRAVENOUS | Status: DC | PRN

## 2020-03-24 MED ORDER — NIFEDIPINE ER OSMOTIC RELEASE 90 MG PO TB24
90.0000 mg | ORAL_TABLET | Freq: Every day | ORAL | Status: DC
  Administered 2020-03-25 – 2020-03-30 (×6): 90 mg via ORAL
  Filled 2020-03-24 (×4): qty 1
  Filled 2020-03-24 (×2): qty 3
  Filled 2020-03-24: qty 1

## 2020-03-24 MED ORDER — ASPIRIN EC 81 MG PO TBEC
81.0000 mg | DELAYED_RELEASE_TABLET | Freq: Every day | ORAL | Status: DC
  Administered 2020-03-25 – 2020-03-26 (×2): 81 mg via ORAL
  Filled 2020-03-24 (×2): qty 1

## 2020-03-24 MED ORDER — PRENATAL COMPLETE 14-0.4 MG PO TABS: 1.0000 | ORAL_TABLET | Freq: Every day | ORAL | Status: DC

## 2020-03-24 MED ORDER — ACETAMINOPHEN 325 MG PO TABS: 650.0000 mg | ORAL_TABLET | ORAL | Status: DC | PRN

## 2020-03-24 MED ORDER — CALCIUM CARBONATE ANTACID 500 MG PO CHEW: 2.0000 | CHEWABLE_TABLET | ORAL | Status: DC | PRN

## 2020-03-24 MED ORDER — ZOLPIDEM TARTRATE 5 MG PO TABS: 5.0000 mg | ORAL_TABLET | Freq: Every evening | ORAL | Status: DC | PRN

## 2020-03-24 NOTE — H&P (Signed)
[name] is a 34 y.o. female [phone] at 55 + w CHTN, worsening BP.  Normal labs.  Recent increase of Procardia XL to 90.  Today added Labetalol 200mg  bid. Has history of PreEclampsia, on baby ASA.   Pregnancy also complicated by diet controlled GDM, hasn't been checking sugars.  Pt has velamentous cord insertion, possible vasa previa.  Also maternal obesity.  Also CF carrier, poss silent SMA carrier.  FOB neg for both. Seen in office today, pressures not controlled, some symptoms - have been present for several weeks.     OB History    Gravida  4   Para  2   Term  1   Preterm  1   AB  1   Living  3     SAB  1   TAB      Ectopic      Multiple  1   Live Births  3         [phone] G1 twins 82 wk female 4#15, female 3#14 SVD G2 38wk VAVD 6#6 female  No abn pap, last 2018 +Chl, +GC  Past Medical History:  Diagnosis Date  . High cholesterol   . Hypertension   eczema,allergies  History reviewed. No pertinent surgical history. Family History: family history includes Cancer in her mother; Diabetes in her father and mother; Hypertension in her father and mother. Social History:  reports that she has never smoked. She has never used smokeless tobacco. She reports previous alcohol use. She reports that she does not use drugs. single in relationship;airbag inspecter  Meds Nifedipine 90, PNV, baby ASA All NKDA      Maternal Diabetes: Yes:  Diabetes Type:  Diet controlled Genetic Screening: Normal Maternal Ultrasounds/Referrals: Other:velamentous cord insertion/? Vasa previa Fetal Ultrasounds or other Referrals:  Referred to Materal Fetal Medicine  Maternal Substance Abuse:  No Significant Maternal Medications:  Meds include: Protonix Other: Nifedipine Significant Maternal Lab Results:  None Other Comments:  None  Review of Systems  Constitutional: Negative.   HENT: Negative.   Eyes: Positive for visual disturbance.  Respiratory: Negative.   Cardiovascular:  Negative.   Gastrointestinal: Negative.   Genitourinary: Negative.   Musculoskeletal: Positive for back pain.  Skin: Negative.   Neurological: Positive for headaches.  Psychiatric/Behavioral: Negative.    Maternal Medical History:  Fetal activity: Perceived fetal activity is normal.    Prenatal complications: PIH.   CF carrier, possible silent SMA; FOB CF neg, SMA neg H/o PreEclampsia Velamentous cord insertion, poss vasa previa  Prenatal Complications - Diabetes: gestational. Diabetes is managed by diet.        Blood pressure 136/82, pulse 98, temperature 98.2 F (36.8 C), resp. rate 18, height 5\' 6"  (1.676 m), weight 106.1 kg, last menstrual period 07/07/2019, unknown if currently breastfeeding. Maternal Exam:  Abdomen: Patient reports no abdominal tenderness. Fundal height is appropriate for gestation.   Estimated fetal weight is 4#.       Physical Exam  Constitutional: She is oriented to person, place, and time. She appears well-developed and well-nourished.  HENT:  Head: Normocephalic and atraumatic.  Cardiovascular: Normal rate and regular rhythm.  Respiratory: Effort normal and breath sounds normal. No respiratory distress. She has no wheezes.  GI: Soft. Bowel sounds are normal. There is no abdominal tenderness.  Musculoskeletal:        General: Normal range of motion.  Neurological: She is alert and oriented to person, place, and time.  Skin: Skin is warm and dry.  Psychiatric: She has a normal mood and affect. Her behavior is normal.    Prenatal labs: ABO, Rh: --/--/A POS (05/24 1941) Antibody: NEG (05/24 1941) Rubella:  immune RPR:   NR HBsAg:   neg HIV:   neg GBS:   unknown  First trimester [place] dates pregnancy [place], nl anat x heart, velamentous cord insertion Good growth, vasa previa? Velamentous cord insertion  Tdap 4/6 Ur Cx neg/GC neg/Chl neg/Hgb 12.4/Plt 285K/Hgb electro WNL/Varicella immune/Hep C neg  glucola 162 - 3hr =  GDM   Assessment/Plan: 33yo [id number] at 34 wCHTN/ increasing BP Procardia and labetalol for BP BMZ for lung maturity Velamentous cord insertion poss vasa previa - MFM appt 5/25 GDM - follow FSBS cosely, will be elevated from [place] NICU consult  Marlyne Totaro Bovard-Stuckert 03/24/2020, 9:07 PM

## 2020-03-24 NOTE — MAU Provider Note (Signed)
History     CSN: [id number]  Arrival date and time: 03/24/20 1541   First Provider Initiated Contact with Patient 03/24/20 1717      Chief Complaint  Patient presents with  . Hypertension   HPI  [name] is a 34 y.o. female [phone] @ [phone] @ [redacted]w[redacted]d with a history of CHTN here in MAU with complaints of elevated BP. She is currenlty taking procardia 90 mg dialy. This dose is up from 60 mg last week. She was here in MAU for the same reason for evaluation. + fetal movement.  + scotoma that started 1 month ago. The vision changes include spots in vision and other times its blurred vision. The last time she had a HA was earlier this morning. She did not take anything for the HA and it subsides on its own. Denies HA now.   OB History    Gravida  4   Para  2   Term  1   Preterm  1   AB  1   Living  3     SAB  1   TAB      Ectopic      Multiple  1   Live Births  3           Past Medical History:  Diagnosis Date  . High cholesterol   . Hypertension     History reviewed. No pertinent surgical history.  Family History  Problem Relation Age of Onset  . Hypertension Mother   . Diabetes Mother   . Cancer Mother   . Hypertension Father   . Diabetes Father     Social History   Tobacco Use  . Smoking status: Never Smoker  . Smokeless tobacco: Never Used  Substance Use Topics  . Alcohol use: Not Currently    Comment: occasionally  . Drug use: No    Allergies: No Known Allergies  Medications Prior to Admission  Medication Sig Dispense Refill Last Dose  . aspirin EC 81 MG tablet Take 81 mg by mouth daily.   03/24/2020 at Unknown time  . NIFEdipine (ADALAT CC) 90 MG 24 hr tablet Take 1 tablet (90 mg total) by mouth daily. 30 tablet 0 03/24/2020 at Unknown time  . Prenatal Vit-Fe Fumarate-FA (PRENATAL COMPLETE) 14-0.4 MG TABS Take 1 tablet by mouth daily. 60 each 0 03/24/2020 at Unknown time  . acetaminophen (TYLENOL) 325 MG tablet Take 1 tablet  (325 mg total) by mouth every 6 (six) hours as needed. 11 tablet 0   . pantoprazole (PROTONIX) 40 MG tablet Take 40 mg by mouth daily.      Results for orders placed or performed during the hospital encounter of 03/24/20 (from the past 48 hour(s))  Urinalysis, Routine w reflex microscopic     Status: Abnormal   Collection Time: 03/24/20  4:10 PM  Result Value Ref Range   Color, Urine YELLOW YELLOW   APPearance HAZY (A) CLEAR   Specific Gravity, Urine 1.027 1.005 - 1.030   pH 5.0 5.0 - 8.0   Glucose, UA NEGATIVE NEGATIVE mg/dL   Hgb urine dipstick NEGATIVE NEGATIVE   Bilirubin Urine NEGATIVE NEGATIVE   Ketones, ur NEGATIVE NEGATIVE mg/dL   Protein, ur 30 (A) NEGATIVE mg/dL   Nitrite NEGATIVE NEGATIVE   Leukocytes,Ua NEGATIVE NEGATIVE   RBC / HPF 0-5 0 - 5 RBC/hpf   WBC, UA 0-5 0 - 5 WBC/hpf   Bacteria, UA FEW (A) NONE SEEN   Squamous Epithelial /  LPF 0-5 0 - 5   Mucus PRESENT     Comment: Performed at [facility], [place] [address], [place], [place] [address]  CBC     Status: Abnormal   Collection Time: 03/24/20  5:12 PM  Result Value Ref Range   WBC 8.8 4.0 - 10.5 K/uL   RBC 3.58 (L) 3.87 - 5.11 MIL/uL   Hemoglobin 10.7 (L) 12.0 - 15.0 g/dL   HCT 32.0 (L) 36.0 - 46.0 %   MCV 89.4 80.0 - 100.0 fL   MCH 29.9 26.0 - 34.0 pg   MCHC 33.4 30.0 - 36.0 g/dL   RDW 13.2 11.5 - 15.5 %   Platelets 189 150 - 400 K/uL   nRBC 0.0 0.0 - 0.2 %    Comment: Performed at [facility], [place] [address] [place], [place] [address]  Comprehensive metabolic panel     Status: Abnormal   Collection Time: 03/24/20  5:12 PM  Result Value Ref Range   Sodium 136 135 - 145 mmol/L   Potassium 4.0 3.5 - 5.1 mmol/L    Comment: SLIGHT HEMOLYSIS   Chloride 109 98 - 111 mmol/L   CO2 16 (L) 22 - 32 mmol/L   Glucose, Bld 100 (H) 70 - 99 mg/dL    Comment: Glucose reference range applies only to samples taken after fasting for at least 8 hours.   BUN 8 6 - 20 mg/dL   Creatinine, Ser 0.66 0.44 - 1.00  mg/dL   Calcium 8.9 8.9 - 10.3 mg/dL   Total Protein 5.7 (L) 6.5 - 8.1 g/dL   Albumin 2.4 (L) 3.5 - 5.0 g/dL   AST 26 15 - 41 U/L   ALT 18 0 - 44 U/L   Alkaline Phosphatase 237 (H) 38 - 126 U/L   Total Bilirubin 0.3 0.3 - 1.2 mg/dL   GFR calc non Af Amer >60 >60 mL/min   GFR calc Af Amer >60 >60 mL/min   Anion gap 11 5 - 15    Comment: Performed at [facility] [address], [place], [place] [address]  Protein / creatinine ratio, urine     Status: Abnormal   Collection Time: 03/24/20  5:12 PM  Result Value Ref Range   Creatinine, Urine 204.28 mg/dL   Total Protein, Urine 33 mg/dL    Comment: NO NORMAL RANGE ESTABLISHED FOR THIS TEST   Protein Creatinine Ratio 0.16 (H) 0.00 - 0.15 mg/mg[Cre]    Comment: Performed at [facility] [address] [place], [place] [address]   Review of Systems  Eyes: Positive for visual disturbance.  Gastrointestinal: Negative for abdominal pain.  Neurological: Negative for headaches.   Physical Exam   Blood pressure (!) 157/99, pulse 90, temperature 98.2 F (36.8 C), resp. rate 18, last menstrual period 07/07/2019, unknown if currently breastfeeding.   Patient Vitals for the past 24 hrs:  BP Temp Pulse Resp  03/24/20 1916 136/82 -- 98 --  03/24/20 1900 132/75 -- (!) 102 --  03/24/20 1846 139/82 -- 100 --  03/24/20 1831 (!) 155/97 -- (!) 101 --  03/24/20 1816 (!) 162/95 -- 94 --  03/24/20 1801 (!) 154/90 -- 88 --  03/24/20 1746 (!) 161/98 -- 91 --  03/24/20 1731 (!) 162/101 -- 93 --  03/24/20 1716 (!) 157/99 -- 90 --  03/24/20 1701 (!) 155/102 -- 95 --  03/24/20 1646 (!) 161/99 -- 93 --  03/24/20 1631 (!) 155/105 -- 97 --  03/24/20 1616 (!) 155/104 -- 95 --  03/24/20 1604 (!) 156/100 98.2 F (36.8 C) 94 18    Physical Exam  Constitutional: She is oriented to person, place, and time. She appears well-developed and well-nourished. No distress.  HENT:  Head: Normocephalic.  Eyes: Pupils are equal, round, and reactive to  light.  Cardiovascular: Normal rate.  Respiratory: Effort normal and breath sounds normal.  GI: Soft. She exhibits no distension. There is no abdominal tenderness. There is no rebound.  Musculoskeletal:        General: Normal range of motion.  Neurological: She is alert and oriented to person, place, and time. She has normal reflexes. She displays normal reflexes.  Negative clonus   Skin: Skin is warm. She is not diaphoretic.  Psychiatric: Her behavior is normal.   Fetal Tracing: Baseline: 130 bpm Variability: Moderate  Accelerations: 15x15 Decelerations: None Toco: quiet   MAU Course  Procedures  None  MDM   Severe range BP readings: Labetalol 20 mg IV given  Preeclampsia labs drawn in MAU Betamethasone given first dose Discussed labs and HPI with Dr. Melba Coon.  Dr. Melba Coon to admit patient to [facility] specialty care.  Repeat betamethasone in 24 hours.  Patient notified and agreeable to plan of care  Assessment and Plan   A:  1. Chronic hypertension with superimposed preeclampsia   2. [redacted] weeks gestation of pregnancy     P:  Admit to OB speciality  BMZ in 24 hours Add Labetalol 200 mg PO  Continue procardia 90 mcg daily- start tomorrow. She took AM dose today.   Lezlie Lye, NP 03/24/2020 7:55 PM

## 2020-03-24 NOTE — MAU Note (Signed)
Pt sent for elevated b/p. Had headache earlier today but not now.  Has been having dizziness and blurred vision on and off but none today. Good fetal movement felt. Denies any vag bleeding or discharge.

## 2020-03-25 ENCOUNTER — Observation Stay (HOSPITAL_BASED_OUTPATIENT_CLINIC_OR_DEPARTMENT_OTHER): Payer: BC Managed Care – PPO

## 2020-03-25 ENCOUNTER — Ambulatory Visit: Payer: PRIVATE HEALTH INSURANCE | Attending: Obstetrics and Gynecology

## 2020-03-25 ENCOUNTER — Ambulatory Visit: Payer: PRIVATE HEALTH INSURANCE

## 2020-03-25 DIAGNOSIS — O113 Pre-existing hypertension with pre-eclampsia, third trimester: Secondary | ICD-10-CM

## 2020-03-25 DIAGNOSIS — Z363 Encounter for antenatal screening for malformations: Secondary | ICD-10-CM | POA: Diagnosis not present

## 2020-03-25 DIAGNOSIS — O43123 Velamentous insertion of umbilical cord, third trimester: Secondary | ICD-10-CM | POA: Diagnosis not present

## 2020-03-25 DIAGNOSIS — O119 Pre-existing hypertension with pre-eclampsia, unspecified trimester: Secondary | ICD-10-CM | POA: Diagnosis not present

## 2020-03-25 LAB — GLUCOSE, CAPILLARY
Glucose-Capillary: 95 mg/dL (ref 70–99)
Glucose-Capillary: 166 mg/dL — ABNORMAL HIGH (ref 70–99)
Glucose-Capillary: 158 mg/dL — ABNORMAL HIGH (ref 70–99)

## 2020-03-25 LAB — SARS CORONAVIRUS 2 (TAT 6-24 HRS): SARS Coronavirus 2: NEGATIVE

## 2020-03-25 NOTE — Progress Notes (Signed)
ANTEPARTUM NOTE  S: Patient feeling well this AM. Denies HA, RUQ pain, LE edema, visual changes, N/V, SOB, palpitations. Aware that she has an MFM scan for today and that we will inform MFM she is in-house. +FM, denies VB, LOF, CTX  O: BP (!) 150/90 (BP Location: Right Arm)   Pulse 83   Temp 98.2 F (36.8 C) (Oral)   Resp 18   Ht 5\' 6"  (1.676 m)   Wt 106.1 kg   LMP 07/07/2019   SpO2 100%   BMI 37.77 kg/m   Gen: Comfortable appearing laying in bed CV: CTAB, RRR Abd: Gravid, NTTP MSK: Neg edema BL, neg calf TTP/Homan's BL  Category 1 tracing at this time, TOCO without activity PreE labs: uPC 0.16, Cr 0.66, AST/ALT 26/18, 10.7/32/189  A/P: This is a 33yo [id number] @ 34 1/7 admitted for uncontrolled CHTN on meds. PNC c/b GDMA1, BMI 36, velamentous cord with possible vasa previa  Continue Procardia 90XL plus labetalol 200mg  BID while in house. Completing BMTZ and s/p NICU consult, next dose due 1800today Will contact MFM today to notify regarding in-house scan for placental assessment and growth. Pt aware c-section would be required if vsa previa confirmed.  Continue checking fasting/PP Bg, aware they will be elevated 2/2 BMTZ NST qshift

## 2020-03-25 NOTE — Consult Note (Signed)
[place] 03/25/2020    8:55 AM  Neonatal Medicine Consultation         [name]          MRN:  [id number]  I was called at the request of the patient's obstetrician (Dr. Melba Coon) to speak to this patient due to potential premature delivery at 34+ weeks for maternal hypertension.  The patient's prenatal course includes chronic hypertension and superimposed preeclampsia.  She was admitted to the hospital yesterday with unstable BP's.  She is 34 1/7 weeks currently.  She is admitted to [facility], and is receiving treatment that includes procardia and labetalol.  She will be seen by MFM physician later today.  The baby is a female.  I reviewed expectations for a baby born at 34+ weeks, including survival, length of stay, respiratory distress, infection, feeding.  I described how we provide respiratory and feeding support.  Mom plans to breast feed, which I encouraged as best for the baby (with supplementations for needed calories possibly).  I let mom know that the baby's outlook generally improves the longer she remains undelivered.  I spent 20 minutes reviewing the record, speaking to the patient, and entering appropriate documentation.  More than 50% of the time was spent face to face with patient.   _____________________ Roosevelt Locks, MD Attending Neonatologist

## 2020-03-26 ENCOUNTER — Observation Stay (HOSPITAL_COMMUNITY): Payer: BC Managed Care – PPO | Admitting: Anesthesiology

## 2020-03-26 ENCOUNTER — Encounter (HOSPITAL_COMMUNITY): Payer: Self-pay | Admitting: Obstetrics and Gynecology

## 2020-03-26 ENCOUNTER — Encounter (HOSPITAL_COMMUNITY)
Admission: AD | Disposition: A | Discharge status: Home / Self Care | Payer: Self-pay | Source: Home / Self Care | Attending: Obstetrics and Gynecology

## 2020-03-26 DIAGNOSIS — O24420 Gestational diabetes mellitus in childbirth, diet controlled: Secondary | ICD-10-CM | POA: Diagnosis present

## 2020-03-26 DIAGNOSIS — O119 Pre-existing hypertension with pre-eclampsia, unspecified trimester: Secondary | ICD-10-CM | POA: Diagnosis not present

## 2020-03-26 DIAGNOSIS — Z98891 History of uterine scar from previous surgery: Secondary | ICD-10-CM

## 2020-03-26 DIAGNOSIS — Z141 Cystic fibrosis carrier: Secondary | ICD-10-CM | POA: Diagnosis not present

## 2020-03-26 DIAGNOSIS — O3413 Maternal care for benign tumor of corpus uteri, third trimester: Secondary | ICD-10-CM | POA: Diagnosis present

## 2020-03-26 DIAGNOSIS — E669 Obesity, unspecified: Secondary | ICD-10-CM | POA: Diagnosis present

## 2020-03-26 DIAGNOSIS — O24429 Gestational diabetes mellitus in childbirth, unspecified control: Secondary | ICD-10-CM | POA: Diagnosis not present

## 2020-03-26 DIAGNOSIS — Z302 Encounter for sterilization: Secondary | ICD-10-CM | POA: Diagnosis not present

## 2020-03-26 DIAGNOSIS — O43123 Velamentous insertion of umbilical cord, third trimester: Secondary | ICD-10-CM | POA: Diagnosis present

## 2020-03-26 DIAGNOSIS — D62 Acute posthemorrhagic anemia: Secondary | ICD-10-CM | POA: Diagnosis not present

## 2020-03-26 DIAGNOSIS — O133 Gestational [pregnancy-induced] hypertension without significant proteinuria, third trimester: Secondary | ICD-10-CM | POA: Diagnosis not present

## 2020-03-26 DIAGNOSIS — O99214 Obesity complicating childbirth: Secondary | ICD-10-CM | POA: Diagnosis present

## 2020-03-26 DIAGNOSIS — D252 Subserosal leiomyoma of uterus: Secondary | ICD-10-CM | POA: Diagnosis present

## 2020-03-26 DIAGNOSIS — Z3A34 34 weeks gestation of pregnancy: Secondary | ICD-10-CM | POA: Diagnosis not present

## 2020-03-26 DIAGNOSIS — Z20822 Contact with and (suspected) exposure to covid-19: Secondary | ICD-10-CM | POA: Diagnosis present

## 2020-03-26 DIAGNOSIS — Z3A00 Weeks of gestation of pregnancy not specified: Secondary | ICD-10-CM | POA: Diagnosis not present

## 2020-03-26 DIAGNOSIS — O114 Pre-existing hypertension with pre-eclampsia, complicating childbirth: Secondary | ICD-10-CM | POA: Diagnosis present

## 2020-03-26 DIAGNOSIS — O1002 Pre-existing essential hypertension complicating childbirth: Secondary | ICD-10-CM | POA: Diagnosis present

## 2020-03-26 DIAGNOSIS — O9081 Anemia of the puerperium: Secondary | ICD-10-CM | POA: Diagnosis not present

## 2020-03-26 HISTORY — DX: History of uterine scar from previous surgery: Z98.891

## 2020-03-26 LAB — CBC
HCT: 29.1 % — ABNORMAL LOW (ref 36.0–46.0)
Hemoglobin: 9.7 g/dL — ABNORMAL LOW (ref 12.0–15.0)
MCH: 30 pg (ref 26.0–34.0)
MCHC: 33.3 g/dL (ref 30.0–36.0)
MCV: 90.1 fL (ref 80.0–100.0)
Platelets: 183 10*3/uL (ref 150–400)
RBC: 3.23 MIL/uL — ABNORMAL LOW (ref 3.87–5.11)
RDW: 13.5 % (ref 11.5–15.5)
WBC: 11.4 10*3/uL — ABNORMAL HIGH (ref 4.0–10.5)
nRBC: 0.4 % — ABNORMAL HIGH (ref 0.0–0.2)

## 2020-03-26 LAB — GLUCOSE, CAPILLARY
Glucose-Capillary: 109 mg/dL — ABNORMAL HIGH (ref 70–99)
Glucose-Capillary: 157 mg/dL — ABNORMAL HIGH (ref 70–99)
Glucose-Capillary: 93 mg/dL (ref 70–99)
Glucose-Capillary: 91 mg/dL (ref 70–99)

## 2020-03-26 SURGERY — Surgical Case
Anesthesia: Spinal | Site: Abdomen | Wound class: Clean Contaminated

## 2020-03-26 MED ORDER — STERILE WATER FOR IRRIGATION IR SOLN
Status: DC | PRN
  Administered 2020-03-26: 1000 mL

## 2020-03-26 MED ORDER — PRENATAL MULTIVITAMIN CH
1.0000 | ORAL_TABLET | Freq: Every day | ORAL | Status: DC
  Administered 2020-03-27 – 2020-03-29 (×3): 1 via ORAL
  Filled 2020-03-26 (×3): qty 1

## 2020-03-26 MED ORDER — MORPHINE SULFATE (PF) 0.5 MG/ML IJ SOLN
INTRAMUSCULAR | Status: DC | PRN
  Administered 2020-03-26: .15 mg via INTRATHECAL

## 2020-03-26 MED ORDER — FENTANYL CITRATE (PF) 100 MCG/2ML IJ SOLN
INTRAMUSCULAR | Status: AC
  Filled 2020-03-26: qty 2

## 2020-03-26 MED ORDER — SIMETHICONE 80 MG PO CHEW
80.0000 mg | CHEWABLE_TABLET | ORAL | Status: DC | PRN
  Filled 2020-03-26: qty 1

## 2020-03-26 MED ORDER — ACETAMINOPHEN 500 MG PO TABS: 1000.0000 mg | ORAL_TABLET | ORAL | Status: DC

## 2020-03-26 MED ORDER — LACTATED RINGERS IV SOLN: INTRAVENOUS | Status: DC

## 2020-03-26 MED ORDER — MENTHOL 3 MG MT LOZG: 1.0000 | LOZENGE | OROMUCOSAL | Status: DC | PRN

## 2020-03-26 MED ORDER — KETOROLAC TROMETHAMINE 30 MG/ML IJ SOLN: 30.0000 mg | Freq: Four times a day (QID) | INTRAMUSCULAR | Status: AC | PRN

## 2020-03-26 MED ORDER — DEXAMETHASONE SODIUM PHOSPHATE 4 MG/ML IJ SOLN
INTRAMUSCULAR | Status: DC | PRN
  Administered 2020-03-26: 4 mg via INTRAVENOUS

## 2020-03-26 MED ORDER — KETOROLAC TROMETHAMINE 30 MG/ML IJ SOLN
30.0000 mg | Freq: Once | INTRAMUSCULAR | Status: AC
  Administered 2020-03-26: 30 mg via INTRAVENOUS

## 2020-03-26 MED ORDER — SODIUM CHLORIDE 0.9 % IR SOLN
Status: DC | PRN
  Administered 2020-03-26: 1000 mL

## 2020-03-26 MED ORDER — OXYCODONE HCL 5 MG PO TABS
5.0000 mg | ORAL_TABLET | ORAL | Status: DC | PRN
  Administered 2020-03-27 – 2020-03-29 (×8): 10 mg via ORAL
  Administered 2020-03-29: 5 mg via ORAL
  Administered 2020-03-30: 10 mg via ORAL
  Filled 2020-03-26 (×2): qty 2
  Filled 2020-03-26: qty 1
  Filled 2020-03-26 (×8): qty 2

## 2020-03-26 MED ORDER — SOD CITRATE-CITRIC ACID 500-334 MG/5ML PO SOLN
ORAL | Status: AC
  Administered 2020-03-26: 30 mL
  Filled 2020-03-26: qty 30

## 2020-03-26 MED ORDER — DEXAMETHASONE SODIUM PHOSPHATE 4 MG/ML IJ SOLN
INTRAMUSCULAR | Status: AC
  Filled 2020-03-26: qty 1

## 2020-03-26 MED ORDER — ACETAMINOPHEN 160 MG/5ML PO SOLN: 1000.0000 mg | Freq: Once | ORAL | Status: DC

## 2020-03-26 MED ORDER — CEFAZOLIN SODIUM-DEXTROSE 2-4 GM/100ML-% IV SOLN
2.0000 g | Freq: Once | INTRAVENOUS | Status: AC
  Administered 2020-03-26: 2 g via INTRAVENOUS
  Filled 2020-03-26: qty 100

## 2020-03-26 MED ORDER — HYDRALAZINE HCL 20 MG/ML IJ SOLN
10.0000 mg | INTRAMUSCULAR | Status: DC | PRN
  Administered 2020-03-26: 10 mg via INTRAVENOUS

## 2020-03-26 MED ORDER — LABETALOL HCL 5 MG/ML IV SOLN: 40.0000 mg | INTRAVENOUS | Status: DC | PRN

## 2020-03-26 MED ORDER — ONDANSETRON HCL 4 MG/2ML IJ SOLN
INTRAMUSCULAR | Status: DC | PRN
  Administered 2020-03-26: 4 mg via INTRAVENOUS

## 2020-03-26 MED ORDER — MEPERIDINE HCL 25 MG/ML IJ SOLN
12.5000 mg | Freq: Once | INTRAMUSCULAR | Status: AC
  Administered 2020-03-26: 12.5 mg via INTRAVENOUS

## 2020-03-26 MED ORDER — IBUPROFEN 800 MG PO TABS
800.0000 mg | ORAL_TABLET | Freq: Three times a day (TID) | ORAL | Status: AC
  Administered 2020-03-27 – 2020-03-29 (×7): 800 mg via ORAL
  Filled 2020-03-26 (×7): qty 1

## 2020-03-26 MED ORDER — KETOROLAC TROMETHAMINE 30 MG/ML IJ SOLN
INTRAMUSCULAR | Status: AC
  Filled 2020-03-26: qty 1

## 2020-03-26 MED ORDER — HYDRALAZINE HCL 20 MG/ML IJ SOLN
5.0000 mg | INTRAMUSCULAR | Status: DC | PRN
  Administered 2020-03-26: 5 mg via INTRAVENOUS

## 2020-03-26 MED ORDER — NALBUPHINE HCL 10 MG/ML IJ SOLN: 5.0000 mg | Freq: Once | INTRAMUSCULAR | Status: DC | PRN

## 2020-03-26 MED ORDER — ZOLPIDEM TARTRATE 5 MG PO TABS: 5.0000 mg | ORAL_TABLET | Freq: Every evening | ORAL | Status: DC | PRN

## 2020-03-26 MED ORDER — PHENYLEPHRINE HCL-NACL 20-0.9 MG/250ML-% IV SOLN
INTRAVENOUS | Status: AC
  Filled 2020-03-26: qty 250

## 2020-03-26 MED ORDER — LABETALOL HCL 5 MG/ML IV SOLN: 20.0000 mg | INTRAVENOUS | Status: DC | PRN

## 2020-03-26 MED ORDER — SENNOSIDES-DOCUSATE SODIUM 8.6-50 MG PO TABS
2.0000 | ORAL_TABLET | ORAL | Status: DC
  Administered 2020-03-27 – 2020-03-30 (×4): 2 via ORAL
  Filled 2020-03-26 (×4): qty 2

## 2020-03-26 MED ORDER — PROMETHAZINE HCL 25 MG/ML IJ SOLN: 6.2500 mg | INTRAMUSCULAR | Status: DC | PRN

## 2020-03-26 MED ORDER — ACETAMINOPHEN 500 MG PO TABS
1000.0000 mg | ORAL_TABLET | Freq: Four times a day (QID) | ORAL | Status: AC
  Administered 2020-03-27 (×3): 1000 mg via ORAL
  Filled 2020-03-26 (×3): qty 2

## 2020-03-26 MED ORDER — NALOXONE HCL 4 MG/10ML IJ SOLN
1.0000 ug/kg/h | INTRAVENOUS | Status: DC | PRN
  Filled 2020-03-26: qty 5

## 2020-03-26 MED ORDER — ONDANSETRON HCL 4 MG/2ML IJ SOLN: 4.0000 mg | Freq: Three times a day (TID) | INTRAMUSCULAR | Status: DC | PRN

## 2020-03-26 MED ORDER — COCONUT OIL OIL: 1.0000 "application " | TOPICAL_OIL | Status: DC | PRN

## 2020-03-26 MED ORDER — FENTANYL CITRATE (PF) 100 MCG/2ML IJ SOLN
INTRAMUSCULAR | Status: DC | PRN
  Administered 2020-03-26: 15 ug via INTRATHECAL

## 2020-03-26 MED ORDER — DIPHENHYDRAMINE HCL 25 MG PO CAPS: 25.0000 mg | ORAL_CAPSULE | Freq: Four times a day (QID) | ORAL | Status: DC | PRN

## 2020-03-26 MED ORDER — KETOROLAC TROMETHAMINE 30 MG/ML IJ SOLN
30.0000 mg | Freq: Four times a day (QID) | INTRAMUSCULAR | Status: AC | PRN
  Administered 2020-03-27: 30 mg via INTRAVENOUS
  Filled 2020-03-26: qty 1

## 2020-03-26 MED ORDER — MISOPROSTOL 25 MCG QUARTER TABLET
ORAL_TABLET | ORAL | Status: DC | PRN
  Administered 2020-03-26: 800 ug via RECTAL

## 2020-03-26 MED ORDER — MISOPROSTOL 200 MCG PO TABS
ORAL_TABLET | ORAL | Status: AC
  Filled 2020-03-26: qty 4

## 2020-03-26 MED ORDER — SODIUM CHLORIDE 0.9% FLUSH
3.0000 mL | INTRAVENOUS | Status: DC | PRN
  Administered 2020-03-27: 3 mL via INTRAVENOUS

## 2020-03-26 MED ORDER — DIPHENHYDRAMINE HCL 25 MG PO CAPS: 25.0000 mg | ORAL_CAPSULE | ORAL | Status: DC | PRN

## 2020-03-26 MED ORDER — DIBUCAINE (PERIANAL) 1 % EX OINT: 1.0000 "application " | TOPICAL_OINTMENT | CUTANEOUS | Status: DC | PRN

## 2020-03-26 MED ORDER — FENTANYL CITRATE (PF) 100 MCG/2ML IJ SOLN: 25.0000 ug | INTRAMUSCULAR | Status: DC | PRN

## 2020-03-26 MED ORDER — NALOXONE HCL 0.4 MG/ML IJ SOLN: 0.4000 mg | INTRAMUSCULAR | Status: DC | PRN

## 2020-03-26 MED ORDER — WITCH HAZEL-GLYCERIN EX PADS: 1.0000 "application " | MEDICATED_PAD | CUTANEOUS | Status: DC | PRN

## 2020-03-26 MED ORDER — DIPHENHYDRAMINE HCL 50 MG/ML IJ SOLN: 12.5000 mg | INTRAMUSCULAR | Status: DC | PRN

## 2020-03-26 MED ORDER — MEPERIDINE HCL 25 MG/ML IJ SOLN
INTRAMUSCULAR | Status: AC
  Filled 2020-03-26: qty 1

## 2020-03-26 MED ORDER — TETANUS-DIPHTH-ACELL PERTUSSIS 5-2.5-18.5 LF-MCG/0.5 IM SUSP: 0.5000 mL | Freq: Once | INTRAMUSCULAR | Status: DC

## 2020-03-26 MED ORDER — HYDRALAZINE HCL 20 MG/ML IJ SOLN
INTRAMUSCULAR | Status: AC
  Filled 2020-03-26: qty 1

## 2020-03-26 MED ORDER — SIMETHICONE 80 MG PO CHEW
80.0000 mg | CHEWABLE_TABLET | ORAL | Status: DC
  Administered 2020-03-27 – 2020-03-30 (×4): 80 mg via ORAL
  Filled 2020-03-26 (×4): qty 1

## 2020-03-26 MED ORDER — PHENYLEPHRINE HCL-NACL 20-0.9 MG/250ML-% IV SOLN
INTRAVENOUS | Status: DC | PRN
  Administered 2020-03-26: 60 ug/min via INTRAVENOUS

## 2020-03-26 MED ORDER — ACETAMINOPHEN 500 MG PO TABS: 1000.0000 mg | ORAL_TABLET | Freq: Once | ORAL | Status: DC

## 2020-03-26 MED ORDER — MORPHINE SULFATE (PF) 0.5 MG/ML IJ SOLN
INTRAMUSCULAR | Status: AC
  Filled 2020-03-26: qty 10

## 2020-03-26 MED ORDER — ONDANSETRON HCL 4 MG/2ML IJ SOLN
INTRAMUSCULAR | Status: AC
  Filled 2020-03-26: qty 2

## 2020-03-26 MED ORDER — SIMETHICONE 80 MG PO CHEW
80.0000 mg | CHEWABLE_TABLET | Freq: Three times a day (TID) | ORAL | Status: DC
  Administered 2020-03-27 – 2020-03-30 (×10): 80 mg via ORAL
  Filled 2020-03-26 (×10): qty 1

## 2020-03-26 MED ORDER — NALBUPHINE HCL 10 MG/ML IJ SOLN: 5.0000 mg | INTRAMUSCULAR | Status: DC | PRN

## 2020-03-26 MED ORDER — OXYTOCIN 40 UNITS IN NORMAL SALINE INFUSION - SIMPLE MED: 2.5000 [IU]/h | INTRAVENOUS | Status: AC

## 2020-03-26 MED ORDER — LACTATED RINGERS IV SOLN: INTRAVENOUS | Status: DC | PRN

## 2020-03-26 MED ORDER — OXYTOCIN 40 UNITS IN NORMAL SALINE INFUSION - SIMPLE MED
INTRAVENOUS | Status: AC
  Filled 2020-03-26: qty 1000

## 2020-03-26 MED ORDER — OXYTOCIN 40 UNITS IN NORMAL SALINE INFUSION - SIMPLE MED
INTRAVENOUS | Status: DC | PRN
  Administered 2020-03-26: 800 mL via INTRAVENOUS

## 2020-03-26 MED ORDER — BUPIVACAINE IN DEXTROSE 0.75-8.25 % IT SOLN
INTRATHECAL | Status: DC | PRN
  Administered 2020-03-26: 1.6 mL via INTRATHECAL

## 2020-03-26 SURGICAL SUPPLY — 37 items
APL SKNCLS STERI-STRIP NONHPOA (GAUZE/BANDAGES/DRESSINGS) ×1
BENZOIN TINCTURE PRP APPL 2/3 (GAUZE/BANDAGES/DRESSINGS) ×1 IMPLANT
CHLORAPREP W/TINT 26ML (MISCELLANEOUS) ×2 IMPLANT
CLAMP CORD UMBIL (MISCELLANEOUS) ×1 IMPLANT
CLOTH BEACON ORANGE TIMEOUT ST (SAFETY) ×2 IMPLANT
DRAPE C SECTION CLR SCREEN (DRAPES) ×2 IMPLANT
DRSG OPSITE POSTOP 4X10 (GAUZE/BANDAGES/DRESSINGS) ×2 IMPLANT
ELECT REM PT RETURN 9FT ADLT (ELECTROSURGICAL) ×2
ELECTRODE REM PT RTRN 9FT ADLT (ELECTROSURGICAL) ×1 IMPLANT
GLOVE BIO SURGEON STRL SZ 6.5 (GLOVE) ×2 IMPLANT
GLOVE BIOGEL PI IND STRL 7.0 (GLOVE) ×2 IMPLANT
GLOVE BIOGEL PI INDICATOR 7.0 (GLOVE) ×2
GOWN STRL REUS W/TWL LRG LVL3 (GOWN DISPOSABLE) ×4 IMPLANT
HEMOSTAT ARISTA ABSORB 3G PWDR (HEMOSTASIS) ×1 IMPLANT
NS IRRIG 1000ML POUR BTL (IV SOLUTION) ×2 IMPLANT
PACK C SECTION WH (CUSTOM PROCEDURE TRAY) ×2 IMPLANT
PAD ABD 8X7 1/2 STERILE (GAUZE/BANDAGES/DRESSINGS) ×1 IMPLANT
PAD OB MATERNITY 4.3X12.25 (PERSONAL CARE ITEMS) ×2 IMPLANT
RETRACTOR TRAXI PANNICULUS (MISCELLANEOUS) IMPLANT
RETRACTOR WND ALEXIS 25 LRG (MISCELLANEOUS) ×1 IMPLANT
RTRCTR C-SECT PINK 25CM LRG (MISCELLANEOUS) ×1 IMPLANT
RTRCTR WOUND ALEXIS 25CM LRG (MISCELLANEOUS) ×2
SPONGE GAUZE 4X4 12PLY STER LF (GAUZE/BANDAGES/DRESSINGS) ×2 IMPLANT
STRIP CLOSURE SKIN 1/2X4 (GAUZE/BANDAGES/DRESSINGS) ×1 IMPLANT
SUT CHROMIC 1 CTX 36 (SUTURE) ×4 IMPLANT
SUT PLAIN 0 NONE (SUTURE) ×1 IMPLANT
SUT PLAIN 2 0 XLH (SUTURE) ×2 IMPLANT
SUT VIC AB 0 CT1 27 (SUTURE) ×4
SUT VIC AB 0 CT1 27XBRD ANBCTR (SUTURE) ×2 IMPLANT
SUT VIC AB 2-0 CT1 27 (SUTURE) ×2
SUT VIC AB 2-0 CT1 TAPERPNT 27 (SUTURE) ×1 IMPLANT
SUT VIC AB 4-0 KS 27 (SUTURE) ×2 IMPLANT
TAPE CLOTH SURG 4X10 WHT LF (GAUZE/BANDAGES/DRESSINGS) ×1 IMPLANT
TOWEL OR 17X24 6PK STRL BLUE (TOWEL DISPOSABLE) ×2 IMPLANT
TRAXI PANNICULUS RETRACTOR (MISCELLANEOUS) ×1
TRAY FOLEY W/BAG SLVR 14FR LF (SET/KITS/TRAYS/PACK) ×2 IMPLANT
WATER STERILE IRR 1000ML POUR (IV SOLUTION) ×2 IMPLANT

## 2020-03-26 NOTE — Op Note (Signed)
Operative Note    Preoperative Diagnosis: 1. IUP at 51 2/7wks                                              2. Vasa previa                                             3. Requests permanent sterilization - completed family                                              4. Chronic hypertension - controlled                                             5. S/P BMZ   Postoperative Diagnosis: Same   Procedure: Primary low transverse cesarean section with double layered closure and bilateral tubal ligation   Surgeon: Mickle Mallory DO Assist: RNFA Guadelupe Sabin  Anesthesia:  Spinal  Fluids: LR 3067ml EBL: 777ml UOP: 254ml   Findings: Viable female infant in vertex position, Nuchal x 2 - loose. Grossly normal uterus with small subserosal fibroids, normal tubes and ovaries. Apgars 7,8,9 Weight 4lbs 9oz   Specimen: Placenta and portion of both fallopian tubes to pathology   Procedure Note  Pt was counseled regarding vasa previa. Reviewed recommendations for delivery given s/p BMZ. Chronic hypertension well controlled. Pt confirms desires permanent sterilization.   Patient was taken to the operating room where spinal anesthesia was administered. She was placed in the dorsal supine position with a leftward tilt. A foley was placed  in a sterile manner to drain the bladder.  Pt was prepped and draped in the usual sterile fashion. An appropriate time out was performed. Allis clamp test confirmed adequate anesthesia. A Pfannenstiel skin incision was then made with the scalpel and carried through to the underlying layer of fascia by sharp dissection and Bovie cautery. The fascia was nicked in the midline and the incision was extended laterally with Mayo scissors. The superior, then inferior, aspects of the incision were grasped with kocher clamps and dissected off the underlying rectus muscles. Rectus muscles were separated in the midline and the peritoneal cavity entered bluntly. The peritoneal incision  was then extended both superiorly and inferiorly with careful attention to avoid both bowel and bladder. The Alexis self-retaining wound retractor was then placed within the incision and the lower uterine segment exposed. The bladder flap was developed with Metzenbaum scissors and pushed away from the lower uterine segment. The lower uterine segment was then incised in a transverse fashion and the cavity itself entered sharply.  The vertex was then gently delivered from the incision without difficulty. A loose nuchal x 2 was reduced over infants head.  The remainder of the infant delivered easily. Bulb suction of mouth and nose was performed.  After a minute delay, the cord was clamped and cut. The infant was handed off to the waiting NICU team. The placenta was then manually expressed from the uterus and the uterus cleared of all  clots and debris with moist lap sponge. The uterine incision had no extensions however there were several bleeding vessels.  The uterine incision was then repaired in 2 layers:  the first layer was a running locked layer 0 chromic and the second an imbricating layer of the same suture. The tubes and ovaries were inspected and the gutters cleared of all clots and debris. Beginning with the left tube, a 2 cm portion in the mid-isthmus region was suture ligated and the intervening portion cut. Excellent hemostasis was noted. The same was repeated on the patients right side with the same results.  The uterine incision was inspected again and found to be hemostatic. All instruments and sponges as well as the Alexis retractor were then removed from the abdomen. The  peritoneum was then sutured in a running fashion; the rectus muscles were incorporated.The fascia was then closed with 0 Vicryl in a running fashion. Subcutaneous tissue was reapproximated with 3-0 plain in a running fashion. Due to small areas of oozing, Arista was liberally applied. The skin was closed with a subcuticular stitch of  4-0 Vicryl on a Keith needle and then reinforced with benzoin and Steri-Strips and a pressure dressing. 818mcg of cytotec were placed rectally. At the conclusion of the procedure all instruments and sponge counts were correct. Patient was taken to the recovery room in good condition. Baby was reported to be doing well in NICU

## 2020-03-26 NOTE — H&P (View-Only) (Signed)
Patient ID: [name], female   DOB: Feb 25, 1986, 34 y.o.   MRN: [id number] Late entry note Pt see this am. She reported no complaints. Denied HA, SOB or CP. She was appreciating FMs, no contractions or abnormal discharge. I reviewed results of last few BPS which were in well controlled range.  I reiterated that once report from MFM on [place] was availlable, plan of care could then be finalized as was dependent on placental status at this time. Pt wanted to discuss desire for permanent sterilization. She stated she was sure had completed family status. We discussed permancy of a tubal ligation and alternative reversible options such as LARCs. Pt certain of decision.   VSS: 139-144/75-83, 83 EFM - 130s, cat 1 TOCO - no contractions  GEN - NAD ABD - consistent with GA, nontender SVE - deferred  Plan: I received a call from Dr Gertie Exon with MFM after [place] report reviewed. VAsa Previa confirmed with recommendation for delivery between 34-35weeks  As I had left the hospital , en route to office, I called into patients room and reviewed report with her. Pt amenable to delivery today however had eaten breakfast at 845am  Recommend pt keep NPO  Anesthesiologist, OR charge and pts nurse notified of plan for delivery today; per anesthesia, schedule for 445pm today Labs ordered  To OR when ready

## 2020-03-26 NOTE — Progress Notes (Addendum)
Patient ID: [name], female   DOB: 1986-09-23, 34 y.o.   MRN: [id number] Late entry note Pt see this am. She reported no complaints. Denied HA, SOB or CP. She was appreciating FMs, no contractions or abnormal discharge. I reviewed results of last few BPS which were in well controlled range.  I reiterated that once report from MFM on [place] was availlable, plan of care could then be finalized as was dependent on placental status at this time. Pt wanted to discuss desire for permanent sterilization. She stated she was sure had completed family status. We discussed permancy of a tubal ligation and alternative reversible options such as LARCs. Pt certain of decision.   VSS: 139-144/75-83, 83 EFM - 130s, cat 1 TOCO - no contractions  GEN - NAD ABD - consistent with GA, nontender SVE - deferred  Plan: I received a call from Dr Gertie Exon with MFM after [place] report reviewed. VAsa Previa confirmed with recommendation for delivery between 34-35weeks  As I had left the hospital , en route to office, I called into patients room and reviewed report with her. Pt amenable to delivery today however had eaten breakfast at 845am  Recommend pt keep NPO  Anesthesiologist, OR charge and pts nurse notified of plan for delivery today; per anesthesia, schedule for 445pm today Labs ordered  S?P BMZ ( second dose given at 1800 03/25/20) To OR when ready

## 2020-03-26 NOTE — Interval H&P Note (Signed)
History and Physical Interval Note: No change since last note or H/P Reviewed expectations during and after procedure Reiterated consent for permanent sterilization and offered alternative Consent signed Pt to OR when ready  03/26/2020 5:18 PM  [name]  has presented today for surgery, with the diagnosis of * No pre-op diagnosis entered *.  The various methods of treatment have been discussed with the patient and family. After consideration of risks, benefits and other options for treatment, the patient has consented to  Procedure(s): CESAREAN SECTION (N/A) as a surgical intervention.  The patient's history has been reviewed, patient examined, no change in status, stable for surgery.  I have reviewed the patient's chart and labs.  Questions were answered to the patient's satisfaction.     Isaiah Serge

## 2020-03-26 NOTE — Anesthesia Preprocedure Evaluation (Addendum)
Anesthesia Evaluation  Patient identified by MRN, date of birth, ID band Patient awake    Reviewed: Allergy & Precautions, NPO status , Patient's Chart, lab work & pertinent test results, reviewed documented beta blocker date and time   History of Anesthesia Complications Negative for: history of anesthetic complications  Airway Mallampati: II  TM Distance: >3 FB Neck ROM: Full    Dental no notable dental hx.    Pulmonary neg pulmonary ROS,    Pulmonary exam normal        Cardiovascular hypertension, Pt. on medications and Pt. on home beta blockers Normal cardiovascular exam     Neuro/Psych negative neurological ROS  negative psych ROS   GI/Hepatic Neg liver ROS, GERD  Medicated and Controlled,  Endo/Other  diabetes, Gestational  Renal/GU negative Renal ROS  negative genitourinary   Musculoskeletal negative musculoskeletal ROS (+)   Abdominal   Peds  Hematology  (+) anemia , Hgb 9.7, plt 183   Anesthesia Other Findings Day of surgery medications reviewed with patient.  Reproductive/Obstetrics (+) Pregnancy velamentous cord with possible vasa previa                            Anesthesia Physical Anesthesia Plan  ASA: III  Anesthesia Plan: Spinal   Post-op Pain Management:    Induction:   PONV Risk Score and Plan: 4 or greater and Treatment may vary due to age or medical condition, Ondansetron and Dexamethasone  Airway Management Planned: Natural Airway  Additional Equipment: None  Intra-op Plan:   Post-operative Plan:   Informed Consent: I have reviewed the patients History and Physical, chart, labs and discussed the procedure including the risks, benefits and alternatives for the proposed anesthesia with the patient or authorized representative who has indicated his/her understanding and acceptance.       Plan Discussed with: CRNA  Anesthesia Plan Comments: (Ate  breakfast at 08:45 today. Will proceed at or after 16:45. Daiva Huge, MD)       Anesthesia Quick Evaluation

## 2020-03-26 NOTE — Anesthesia Procedure Notes (Signed)
Spinal  Patient location during procedure: OR Start time: 03/26/2020 5:09 PM End time: 03/26/2020 5:11 PM Staffing Performed: anesthesiologist  Anesthesiologist: Brennan Bailey, MD Preanesthetic Checklist Completed: patient identified, IV checked, risks and benefits discussed, monitors and equipment checked, pre-op evaluation and timeout performed Spinal Block Patient position: sitting Prep: DuraPrep and site prepped and draped Patient monitoring: heart rate, continuous pulse ox and blood pressure Approach: midline Location: L3-4 Injection technique: single-shot Needle Needle type: Pencan  Needle gauge: 24 G Needle length: 10 cm Assessment Sensory level: T4 Additional Notes Risks, benefits, and alternative discussed. Patient gave consent to procedure. Prepped and draped in sitting position. Clear CSF obtained after one needle pass. Positive terminal aspiration. No pain or paraesthesias with injection. Patient tolerated procedure well. Vital signs stable. Tawny Asal, MD

## 2020-03-26 NOTE — Progress Notes (Signed)
Pt transferred to OR for scheduled csection.

## 2020-03-26 NOTE — Transfer of Care (Signed)
Immediate Anesthesia Transfer of Care Note  Patient: [name]  Procedure(s) Performed: CESAREAN SECTION WITH BILATERAL TUBAL LIGATION (N/A Abdomen)  Patient Location: PACU  Anesthesia Type:Spinal  Level of Consciousness: awake, alert  and patient cooperative  Airway & Oxygen Therapy: Patient Spontanous Breathing  Post-op Assessment: Report given to RN and Post -op Vital signs reviewed and stable  Post vital signs: Reviewed and stable  Last Vitals:  Vitals Value Taken Time  BP 141/77 03/26/20 2320  Temp 37.4 C 03/26/20 2320  Pulse 82 03/26/20 2320  Resp 18 03/26/20 2320  SpO2 97 % 03/26/20 2320    Last Pain:  Vitals:   03/26/20 2320  TempSrc: Oral  PainSc: 0-No pain      Patients Stated Pain Goal: 3 ([id number])  Complications: No apparent anesthesia complications

## 2020-03-27 LAB — CBC
HCT: 24.5 % — ABNORMAL LOW (ref 36.0–46.0)
Hemoglobin: 8.3 g/dL — ABNORMAL LOW (ref 12.0–15.0)
MCH: 30.6 pg (ref 26.0–34.0)
MCHC: 33.9 g/dL (ref 30.0–36.0)
MCV: 90.4 fL (ref 80.0–100.0)
Platelets: 140 10*3/uL — ABNORMAL LOW (ref 150–400)
RBC: 2.71 MIL/uL — ABNORMAL LOW (ref 3.87–5.11)
RDW: 13.5 % (ref 11.5–15.5)
WBC: 14.3 10*3/uL — ABNORMAL HIGH (ref 4.0–10.5)
nRBC: 0.3 % — ABNORMAL HIGH (ref 0.0–0.2)

## 2020-03-27 LAB — RPR: RPR Ser Ql: NONREACTIVE

## 2020-03-27 NOTE — Lactation Note (Signed)
This note was copied from a baby's chart. Lactation Consultation Note Baby 7 hrs old when [facility] spoke w/mom. Mom has DEBP set up in room. Mom stated she hasn't used it yet, that she isn't ready to use it. She will later when she has rested. This is mom's 4th child. Stated she BF her daughter for a short time. Mom stated she will let Lactation know when she is ready.  Patient Name: [name] [id number] Date: 03/27/2020     Maternal Data    Feeding Feeding Type: Donor Breast Milk  LATCH Score                   Interventions    Lactation Tools Discussed/Used     Consult Status      Shamekia Tippets, Elta Guadel[place] 03/27/2020, 3:32 AM

## 2020-03-27 NOTE — Progress Notes (Signed)
Subjective: Postpartum Day 1: Cesarean Delivery vasa previa/CHTN with exacerbation/A1GDM  Patient reports tolerating PO and no problems voiding.  Ambulating without dizziness.  Denies HA or PIH sx.  To try to pump today  Objective: Vital signs in last 24 hours: Temp:  [97.1 F (36.2 C)-99.3 F (37.4 C)] 98.6 F (37 C) (05/27 0825) Pulse Rate:  [65-95] 67 (05/27 0825) Resp:  [16-34] 20 (05/27 0825) BP: (136-169)/(73-98) 155/93 (05/27 0825) SpO2:  [97 %-100 %] 99 % (05/27 0825)  Diuresing well  Physical Exam:  General: alert and cooperative Lochia: appropriate Uterine Fundus: firm Incision: C/D/I pressure dressing DVT Evaluation: No evidence of DVT seen on physical exam.  Recent Labs    03/26/20 1423 03/27/20 0523  HGB 9.7* 8.3*  HCT 29.1* 24.5*    Assessment/Plan: Status post Cesarean section. Postoperative course complicated by  hypertension with exacerbation.  BP well-controlled overnight, due meds this AM.  Procardia XL 90mg  and labetalol 200mg  po BID, was on norvasc 10mg  po daily prior to pregnancy per pt.  D/w her will just have to follow BP and adjust medications as needed. Relative anemia on admission with pre-op hgb 9.7 down to 8.3, tolerating well.  Will repeat tomorrow AM when more equilibrated and if below 8.0 consider feraheme Remove pressure dressing this PM [name] 03/27/2020, 8:56 AM

## 2020-03-27 NOTE — Clinical Social Work Maternal (Signed)
CLINICAL SOCIAL WORK MATERNAL/CHILD NOTE  Patient Details  Name: [name] MRN: [id number] Date of Birth: 08/23/1986  Date:  03/27/2020  Clinical Social Worker Initiating Note:  Kamree Wiens, LCSW Date/Time: Initiated:  03/27/20/1451     Child's Name:  [name]   Biological Parents:  Mother, Father(Father: Megale Johnston)   Need for Interpreter:  None   Reason for Referral:  Other (Comment), Behavioral Health Concerns(NICU Admission)   Address:  [address] [place] [address]    Phone number:  [phone] (home)     Additional phone number:   Household Members/Support Persons (HM/SP):   Household Member/Support Person 1, Household Member/Support Person 2, Household Member/Support Person 3, Household Member/Support Person 4   HM/SP Name Relationship DOB or Age  HM/SP -1 La'Quain Robinson son 04/28/07  HM/SP -2 Sa'Quain Robinson son 04/28/07  HM/SP -3 Londyn Waddell daughter 09/06/12  HM/SP -4 Megale Johnston FOB    HM/SP -5        HM/SP -6        HM/SP -7        HM/SP -8          Natural Supports (not living in the home):  Extended Family, Parent, Immediate Family   Professional Supports: None   Employment: Full-time   Type of Work: Inspector   Education:  High school graduate   Homebound arranged:    Financial Resources:  Private Insurance   Other Resources:  WIC, Food Stamps    Cultural/Religious Considerations Which May Impact Care:    Strengths:  Ability to meet basic needs , Home prepared for child , Understanding of illness, Pediatrician chosen   Psychotropic Medications:         Pediatrician:    [place] area  Pediatrician List:   [facility] [facility]  [place]    [place]    [place]    [place]    [place]      Pediatrician Fax Number:    Risk Factors/Current Problems:  None   Cognitive State:  Able to Concentrate , Alert , Goal Oriented ,  Insightful , Linear Thinking    Mood/Affect:  Interested , Comfortable , Calm , Happy    CSW Assessment: CSW met with MOB at bedside to discuss infant's NICU admission and behavioral health concerns. CSW introduced self and explained reason for consult. MOB was welcoming, pleasant and engaged during assessment. MOB reported that she resides with FOB and three older children. 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 MOB reported that she has all items needed to care for infant including a car seat and crib. CSW inquired about MOB's support system, MOB reported that her mom and FOB are supports. MOB shared that she has a lot of family support including aunts and cousins.   CSW inquired about MOB's mental health history, MOB denied any mental health history. CSW inquired about any postpartum depression history, MOB reported that she had postpartum depression in 2008 when she had her twins. MOB endorsed symptoms of crying, isolating and feeling sad. MOB reported that the postpartum depression started right after giving birth and lasted about month. MOB reported that she was started on medication but cannot remember if it was helpful. MOB shared that she had some ups and down during the pregnancy associated with being pregnant. CSW inquired about how MOB was feeling emotionally after giving birth, MOB reported that she was feeling   great. MOB presented calm and did not demonstrate any acute mental health signs/symptoms. CSW assessed for safety, MOB denied SI, HI and domestic violence.   CSW provided education regarding the baby blues period vs. perinatal mood disorders, discussed treatment and gave resources for mental health follow up if concerns arise.  CSW recommends self-evaluation during the postpartum time period using the New Mom Checklist from Postpartum Progress and encouraged MOB to contact a medical professional if symptoms are noted at any time.    CSW  provided review of Sudden Infant Death Syndrome (SIDS) precautions.    CSW and MOB discussed infant's NICU admission. CSW informed MOB about the NICU, what to expect and resources/supports available while infant is admitted to the NICU. MOB reported that she feels well informed about infant's care and shared her twins were in the NICU. MOB denied any transportation barriers with visiting infant in the NICU. MOB denied any questions/concerns regarding the NICU.   CSW will continue to offer resources/supports while infant is admitted to the NICU.    CSW Plan/Description:  Sudden Infant Death Syndrome (SIDS) Education, Perinatal Mood and Anxiety Disorder (PMADs) Education, Other Patient/Family Education    Ara Mano L Jabre Heo, LCSW 03/27/2020, 3:07 PM 

## 2020-03-27 NOTE — Progress Notes (Signed)
CSW completed chart review and attempted to meet with MOB to complete psychosocial assessment, however MOB was asleep. CSW will attempt to meet with MOB at a later time.   [name], [place] Worker [facility] Cell#: [phone]

## 2020-03-27 NOTE — Progress Notes (Signed)
Patient rating pain in back 7/10. Scheduled tylenol and prn Toradol given with little relief. Duramorph was given at 1711. Spoke to Dr. Terri Piedra and she said it was fine to give the prn oxycodone.

## 2020-03-27 NOTE — Lactation Note (Signed)
This note was copied from a baby's chart. Lactation Consultation Note  Patient Name: [name] [id number] Date: 03/27/2020   [facility] in to visit with Mom who was resting in bed with infant STS on Mom's chest.   Mom has DEBP set up, but hasn't pumped yet.  Mom stated she knew she needed to start, and will pump later.  Mom to ask for assistance with first pumping.  RN aware. Set up washing and drying bin for pump parts.  Broadus John 03/27/2020, 3:17 PM

## 2020-03-27 NOTE — Anesthesia Postprocedure Evaluation (Signed)
Anesthesia Post Note  Patient: [name]  Procedure(s) Performed: CESAREAN SECTION WITH BILATERAL TUBAL LIGATION (N/A Abdomen)     Patient location during evaluation: PACU Anesthesia Type: Spinal Level of consciousness: awake and alert and oriented Pain management: pain level controlled Vital Signs Assessment: post-procedure vital signs reviewed and stable Respiratory status: spontaneous breathing, nonlabored ventilation and respiratory function stable Cardiovascular status: blood pressure returned to baseline Postop Assessment: no apparent nausea or vomiting, spinal receding, no headache and no backache Anesthetic complications: no                  Brennan Bailey

## 2020-03-27 NOTE — Lactation Note (Signed)
This note was copied from a baby's chart. Lactation Consultation Note  Patient Name: Girl Jenalyn Darden [id number] Date: 03/27/2020 Reason for consult: Follow-up assessment;NICU baby;Late-preterm 34-36.6wks  LC in to check on Mom's desire to start pumping.  [place]on RN is going to assist Mom after she has her breakfast.  Pump set up and Mom would like assistance with first pumping.    Dr. Marvel Plan states Mom is interested in pumping and plans to start soon.  Interventions Interventions: DEBP;Skin to skin;Breast massage;Hand express  Consult Status Consult Status: Follow-up Date: 03/28/20 Follow-up type: In-patient    Broadus John 03/27/2020, 9:13 AM

## 2020-03-28 LAB — COMPREHENSIVE METABOLIC PANEL
ALT: 19 U/L (ref 0–44)
AST: 20 U/L (ref 15–41)
Albumin: 2.2 g/dL — ABNORMAL LOW (ref 3.5–5.0)
Alkaline Phosphatase: 165 U/L — ABNORMAL HIGH (ref 38–126)
Anion gap: 6 (ref 5–15)
BUN: 7 mg/dL (ref 6–20)
CO2: 24 mmol/L (ref 22–32)
Calcium: 8.5 mg/dL — ABNORMAL LOW (ref 8.9–10.3)
Chloride: 106 mmol/L (ref 98–111)
Creatinine, Ser: 0.54 mg/dL (ref 0.44–1.00)
GFR calc Af Amer: >60 mL/min (ref >60)
GFR calc non Af Amer: >60 mL/min (ref >60)
Glucose, Bld: 80 mg/dL (ref 70–99)
Potassium: 3.5 mmol/L (ref 3.5–5.1)
Sodium: 136 mmol/L (ref 135–145)
Total Bilirubin: 0.4 mg/dL (ref 0.3–1.2)
Total Protein: 5.4 g/dL — ABNORMAL LOW (ref 6.5–8.1)

## 2020-03-28 LAB — CBC
HCT: 27.5 % — ABNORMAL LOW (ref 36.0–46.0)
Hemoglobin: 9.2 g/dL — ABNORMAL LOW (ref 12.0–15.0)
MCH: 30.1 pg (ref 26.0–34.0)
MCHC: 33.5 g/dL (ref 30.0–36.0)
MCV: 89.9 fL (ref 80.0–100.0)
Platelets: 188 10*3/uL (ref 150–400)
RBC: 3.06 MIL/uL — ABNORMAL LOW (ref 3.87–5.11)
RDW: 13.6 % (ref 11.5–15.5)
WBC: 14.2 10*3/uL — ABNORMAL HIGH (ref 4.0–10.5)
nRBC: 0.2 % (ref 0.0–0.2)

## 2020-03-28 LAB — SURGICAL PATHOLOGY

## 2020-03-28 MED ORDER — ACETAMINOPHEN 500 MG PO TABS
1000.0000 mg | ORAL_TABLET | Freq: Four times a day (QID) | ORAL | Status: DC | PRN
  Administered 2020-03-28 – 2020-03-30 (×6): 1000 mg via ORAL
  Filled 2020-03-28 (×7): qty 2

## 2020-03-28 NOTE — Progress Notes (Signed)
POD #2 LTCS Doing ok, sore, baby stable Afeb, VSS, BP controlled Abd- soft, fundus firm, pressure dressing in place Hgb 9.7 to 8.3 to 9.2 this am  Will continue routine care, continue Procardia and Labetalol.  Hgb stable so no need for transfusion or Iv iron

## 2020-03-28 NOTE — Progress Notes (Signed)
CSW received and acknowledges consult for EDPS of 9.  Consult screened out due to 9 on EDPS does not warrant a CSW consult.  MOB whom scores are greater than 9/yes to question 10 on Edinburgh Postpartum Depression Screen warrants a CSW consult. A full psychosocial assessment was completed with MOB due to NICU admission and PMADs education was provided.    Abundio Miu, [place] Worker [facility] Cell#: [phone]

## 2020-03-29 NOTE — Progress Notes (Signed)
POD #3 LTCS Doing ok, sore but better Afeb, VSS, BP 120-140/60-90 Abd- soft, fundus firm, incision intact She wants to stay until tomorrow, continue routine care, continue current Procardia and labetalol

## 2020-03-29 NOTE — Lactation Note (Signed)
This note was copied from a baby's chart. Lactation Consultation Note  Patient Name: [name] [id number] Date: 03/29/2020 Reason for consult: Follow-up assessment;NICU baby [name] is 72hrs old, mom sitting in bed, states undecided if wants to breastfeed/offer EBM at this time. Encouraged mom if undecided to pump to prevent delay in milk volume, mom declined, states she has pumped 1 time, is comfortable with using the pump and understands how to store breastmilk. Mom states she will call if she changes her mind and needs help with pumping and/or breastfeeding. Notified RN. BGilliam, RN, IBCLC  Maternal Data    Feeding Feeding Type: Donor Breast Milk  LATCH Score                   Interventions    Lactation Tools Discussed/Used     Consult Status Consult Status: PRN   [name] 03/29/2020, 7:09 PM

## 2020-03-29 NOTE — Progress Notes (Signed)
Patient states that she has not pumped yet because she has not felt up to it.  RN educated on the importance of pumping for milk supply if her goal is to give breast milk to her baby.  RN explained supply and demand and importance of pumping to establishing milk supply.  Patient voiced understanding and will call for assistance to pump when she is ready.  Shareen Capwell, [place]

## 2020-03-30 ENCOUNTER — Ambulatory Visit: Payer: Self-pay

## 2020-03-30 MED ORDER — OXYCODONE HCL 5 MG PO TABS: 5.0000 mg | ORAL_TABLET | ORAL | 0 refills | Status: AC | PRN

## 2020-03-30 MED ORDER — LABETALOL HCL 200 MG PO TABS: 200.0000 mg | ORAL_TABLET | Freq: Two times a day (BID) | ORAL | 1 refills | Status: AC

## 2020-03-30 NOTE — Lactation Note (Signed)
This note was copied from a baby's chart. Lactation Consultation Note  Patient Name: Girl Eithel Mikulich [id number] Date: 03/30/2020    [facility] Follow Up:  Per previous [place] note, mother will call as needed for breast feeding and/or pumping assistance.      Amily Depp R Biana Haggar 03/30/2020, 4:55 PM

## 2020-03-30 NOTE — Discharge Instructions (Signed)
As per discharge pamphlet °

## 2020-03-30 NOTE — Discharge Summary (Addendum)
Postpartum Discharge Summary      Patient Name: [name] DOB: 11-04-1985 MRN: [id number]  Date of admission: 03/24/2020 Delivery date:03/26/2020  Delivering provider: Sherlyn Hay  Date of discharge: 03/30/2020  Admitting diagnosis: Chronic hypertension with superimposed pre-eclampsia [O11.9] PIH (pregnancy induced hypertension), third trimester [O13.3] Intrauterine pregnancy: [redacted]w[redacted]d     Secondary diagnosis:  Active Problems:   Chronic hypertension with superimposed pre-eclampsia   PIH (pregnancy induced hypertension), third trimester   Vasa previa   S/P cesarean section    Discharge diagnosis: Preterm Pregnancy Delivered, CHTN with superimposed preeclampsia and vasa previa                                              Hospital course: Sceduled C/S   34 y.o. yo [id number] at [redacted]w[redacted]d was admitted to the hospital 03/24/2020 for evaluation for [facility] with superimposed preeclampsia, possible vasa previa, scheduled cesarean section with the following indication:vasa previa.Delivery details are as follows:  Membrane Rupture Time/Date: 5:35 PM ,03/26/2020   Delivery Method:C-Section, Low Transverse  Details of operation can be found in separate operative note.  Patient had an uncomplicated postpartum course.  She was in couplet care, BP controlled with Procardia XL 90 mg daily and Labetalol 200 mg po bid.  She is ambulating, tolerating a regular diet, passing flatus, and urinating well. Patient is discharged home in stable condition on  03/30/20        Newborn Data: Birth date:03/26/2020  Birth time:5:35 PM  Gender:Female  Living status:Living  Apgars:7 ,8  Weight:2060 g      Physical exam  Vitals:   03/29/20 0917 03/29/20 1407 03/29/20 2105 03/30/20 0616  BP: (!) 137/94 139/89 (!) 149/98 (!) 152/98  Pulse: 81 85 91 86  Resp:  16 17 18   Temp:  98.7 F (37.1 C) 98.8 F (37.1 C) 98.7 F (37.1 C)  TempSrc:  Oral Oral Oral  SpO2:  99%  96%  Weight:      Height:        General: alert Lochia: appropriate Uterine Fundus: firm Incision: Healing well with no significant drainage  Labs: Lab Results  Component Value Date   WBC 14.2 (H) 03/28/2020   HGB 9.2 (L) 03/28/2020   HCT 27.5 (L) 03/28/2020   MCV 89.9 03/28/2020   PLT 188 03/28/2020   CMP Latest Ref Rng & Units 03/28/2020  Glucose 70 - 99 mg/dL 80  BUN 6 - 20 mg/dL 7  Creatinine 0.44 - 1.00 mg/dL 0.54  Sodium 135 - 145 mmol/L 136  Potassium 3.5 - 5.1 mmol/L 3.5  Chloride 98 - 111 mmol/L 106  CO2 22 - 32 mmol/L 24  Calcium 8.9 - 10.3 mg/dL 8.5(L)  Total Protein 6.5 - 8.1 g/dL 5.4(L)  Total Bilirubin 0.3 - 1.2 mg/dL 0.4  Alkaline Phos 38 - 126 U/L 165(H)  AST 15 - 41 U/L 20  ALT 0 - 44 U/L 19   Edinburgh Score: Edinburgh Postnatal Depression Scale Screening Tool 03/27/2020  I have been able to laugh and see the funny side of things. 0  I have looked forward with enjoyment to things. 2  I have blamed myself unnecessarily when things went wrong. 2  I have been anxious or worried for no good reason. 2  I have felt scared or panicky for no good reason. 0  Things have been  getting on top of me. 1  I have been so unhappy that I have had difficulty sleeping. 0  I have felt sad or miserable. 1  I have been so unhappy that I have been crying. 1  The thought of harming myself has occurred to me. 0  Edinburgh Postnatal Depression Scale Total 9      After visit meds:  Allergies as of 03/30/2020   No Known Allergies     Medication List    STOP taking these medications   aspirin EC 81 MG tablet   pantoprazole 40 MG tablet Commonly known as: PROTONIX     TAKE these medications   acetaminophen 325 MG tablet Commonly known as: Tylenol Take 1 tablet (325 mg total) by mouth every 6 (six) hours as needed.   diphenhydrAMINE 25 MG tablet Commonly known as: BENADRYL Take 25 mg by mouth every 8 (eight) hours as needed for allergies.   labetalol 200 MG tablet Commonly known as:  NORMODYNE Take 1 tablet (200 mg total) by mouth 2 (two) times daily.   NIFEdipine 90 MG 24 hr tablet Commonly known as: Adalat CC Take 1 tablet (90 mg total) by mouth daily.   oxyCODONE 5 MG immediate release tablet Commonly known as: Oxy IR/ROXICODONE Take 1-2 tablets (5-10 mg total) by mouth every 4 (four) hours as needed for severe pain.   Prenatal Complete 14-0.4 MG Tabs Take 1 tablet by mouth daily.        Discharge home in stable condition Infant Feeding: Breast Infant Disposition:NICU Discharge instruction: per After Visit Summary and Postpartum booklet. Activity: Advance as tolerated. Pelvic rest for 6 weeks.  Diet: routine diet  Postpartum Appointment:3 days for BP check Follow up Visit: [place], [place], DO. Schedule an appointment as soon as possible for a visit in 3 day(s).   Specialty: Obstetrics and Gynecology Why: for BP check Contact information: [address] [place] [address] [phone]               03/30/2020 Clarene Duke, MD

## 2020-03-30 NOTE — Progress Notes (Signed)
POD #4 LTCS Doing ok, nothing new Afeb, VSS, BP 130-150/80-90 Abd- soft, fundus firm, incision intact D/c home

## 2020-04-04 ENCOUNTER — Ambulatory Visit: Payer: Self-pay

## 2020-04-04 NOTE — Lactation Note (Signed)
This note was copied from a baby's chart. Lactation Consultation Note  Patient Name: [name] [id number] Date: 04/04/2020   Per RN, Ms. Alanis prefers to formula feed and does not want to be seen by lactation.  Feeding Feeding Type: Formula    Lenore Manner 04/04/2020, 6:47 PM

## 2020-04-09 ENCOUNTER — Other Ambulatory Visit: Payer: Self-pay | Admitting: Student

## 2020-05-06 DIAGNOSIS — Z1389 Encounter for screening for other disorder: Secondary | ICD-10-CM | POA: Diagnosis not present

## 2020-05-06 DIAGNOSIS — Z3009 Encounter for other general counseling and advice on contraception: Secondary | ICD-10-CM | POA: Diagnosis not present

## 2020-05-06 DIAGNOSIS — G4709 Other insomnia: Secondary | ICD-10-CM | POA: Diagnosis not present

## 2020-07-15 DIAGNOSIS — I10 Essential (primary) hypertension: Secondary | ICD-10-CM | POA: Diagnosis not present

## 2020-07-15 DIAGNOSIS — Z202 Contact with and (suspected) exposure to infections with a predominantly sexual mode of transmission: Secondary | ICD-10-CM | POA: Diagnosis not present

## 2020-07-15 DIAGNOSIS — Z124 Encounter for screening for malignant neoplasm of cervix: Secondary | ICD-10-CM | POA: Diagnosis not present

## 2022-05-17 ENCOUNTER — Other Ambulatory Visit: Payer: Self-pay

## 2022-05-17 ENCOUNTER — Emergency Department (HOSPITAL_COMMUNITY)
Admission: EM | Admit: 2022-05-17 | Discharge: 2022-05-17 | Disposition: A | Discharge status: Home / Self Care | Payer: Medicaid Other | Attending: Emergency Medicine | Admitting: Emergency Medicine

## 2022-05-17 ENCOUNTER — Encounter (HOSPITAL_COMMUNITY): Payer: Self-pay | Admitting: Emergency Medicine

## 2022-05-17 DIAGNOSIS — J029 Acute pharyngitis, unspecified: Secondary | ICD-10-CM | POA: Diagnosis present

## 2022-05-17 DIAGNOSIS — J020 Streptococcal pharyngitis: Secondary | ICD-10-CM | POA: Insufficient documentation

## 2022-05-17 DIAGNOSIS — R509 Fever, unspecified: Secondary | ICD-10-CM | POA: Insufficient documentation

## 2022-05-17 LAB — GROUP A STREP BY PCR: Group A Strep by PCR: DETECTED — AB

## 2022-05-17 LAB — PREGNANCY, URINE: Preg Test, Ur: NEGATIVE

## 2022-05-17 MED ORDER — ACETAMINOPHEN 325 MG PO TABS
650.0000 mg | ORAL_TABLET | Freq: Once | ORAL | Status: AC
  Administered 2022-05-17: 650 mg via ORAL
  Filled 2022-05-17: qty 2

## 2022-05-17 MED ORDER — AMOXICILLIN 500 MG PO CAPS: 1000.0000 mg | ORAL_CAPSULE | Freq: Every day | ORAL | 0 refills | Status: AC

## 2022-05-17 NOTE — Discharge Instructions (Signed)
Use Tylenol every 4 hours and ibuprofen every 6 hours needed for pain or fever. Take antibiotics as directed. Return if no improvement in a week, breathing difficulty, significant swelling or new concerns.

## 2022-05-17 NOTE — ED Provider Notes (Signed)
[facility] EMERGENCY DEPARTMENT Provider Note   CSN: [id number] Arrival date & time: 05/17/22  1142     History  Chief Complaint  Patient presents with   Sore Throat    [name] is a 36 y.o. female.  Patient presents with sore throat since last night.  No significant sick contacts.  Vaccines up-to-date.  Patient's had some chills low-grade fever today.  Tolerating oral liquids.       Home Medications Prior to Admission medications   Medication Sig Start Date End Date Taking? Authorizing Provider  amoxicillin (AMOXIL) 500 MG capsule Take 2 capsules (1,000 mg total) by mouth daily for 10 days. 05/17/22 05/27/22 Yes Elnora Morrison, MD  acetaminophen (TYLENOL) 325 MG tablet Take 1 tablet (325 mg total) by mouth every 6 (six) hours as needed. 01/10/15   Sciacca, Marissa, PA-C  diphenhydrAMINE (BENADRYL) 25 MG tablet Take 25 mg by mouth every 8 (eight) hours as needed for allergies.    [provider]  labetalol (NORMODYNE) 200 MG tablet Take 1 tablet (200 mg total) by mouth 2 (two) times daily. 03/30/20   Meisinger, Sherren Mocha, MD  NIFEdipine (ADALAT CC) 90 MG 24 hr tablet Take 1 tablet (90 mg total) by mouth daily. 03/17/20   Jorje Guild, NP  oxyCODONE (OXY IR/ROXICODONE) 5 MG immediate release tablet Take 1-2 tablets (5-10 mg total) by mouth every 4 (four) hours as needed for severe pain. 03/30/20   Meisinger, Sherren Mocha, MD  Prenatal Vit-Fe Fumarate-FA (PRENATAL COMPLETE) 14-0.4 MG TABS Take 1 tablet by mouth daily. 02/22/18   Larene Pickett, PA-C      Allergies    Patient has no known allergies.    Review of Systems   Review of Systems  Constitutional:  Positive for fever. Negative for chills.  HENT:  Negative for congestion.   Eyes:  Negative for visual disturbance.  Respiratory:  Negative for shortness of breath.   Cardiovascular:  Negative for chest pain.  Gastrointestinal:  Negative for abdominal pain and vomiting.  Genitourinary:  Negative for  dysuria and flank pain.  Musculoskeletal:  Negative for back pain, neck pain and neck stiffness.  Skin:  Negative for rash.  Neurological:  Negative for light-headedness and headaches.    Physical Exam Updated Vital Signs BP (!) 171/106 (BP Location: Left Arm)   Pulse 100   Temp (!) 100.4 F (38 C) (Oral)   Resp 16   SpO2 98%  Physical Exam Vitals and nursing note reviewed.  Constitutional:      General: She is not in acute distress.    Appearance: She is well-developed.  HENT:     Head: Normocephalic and atraumatic.     Mouth/Throat:     Mouth: Mucous membranes are moist.     Pharynx: Posterior oropharyngeal erythema present. No pharyngeal swelling.     Tonsils: Tonsillar exudate present. No tonsillar abscesses.  Eyes:     General:        Right eye: No discharge.        Left eye: No discharge.     Conjunctiva/sclera: Conjunctivae normal.  Neck:     Trachea: No tracheal deviation.  Cardiovascular:     Rate and Rhythm: Normal rate.  Pulmonary:     Effort: Pulmonary effort is normal.     Breath sounds: Normal breath sounds.  Abdominal:     General: There is no distension.     Palpations: Abdomen is soft.     Tenderness: There is no  abdominal tenderness. There is no guarding.  Musculoskeletal:     Cervical back: Normal range of motion and neck supple. No rigidity.  Lymphadenopathy:     Cervical: Cervical adenopathy present.  Skin:    General: Skin is warm.     Capillary Refill: Capillary refill takes less than 2 seconds.     Findings: No rash.  Neurological:     General: No focal deficit present.     Mental Status: She is alert.     Cranial Nerves: No cranial nerve deficit.  Psychiatric:        Mood and Affect: Mood normal.     ED Results / Procedures / Treatments   Labs (all labs ordered are listed, but only abnormal results are displayed) Labs Reviewed  GROUP A STREP BY PCR - Abnormal; Notable for the following components:      Result Value   Group A  Strep by PCR DETECTED (*)    All other components within normal limits  PREGNANCY, URINE    EKG None  Radiology No results found.  Procedures Procedures    Medications Ordered in ED Medications  acetaminophen (TYLENOL) tablet 650 mg (650 mg Oral Given 05/17/22 1410)    ED Course/ Medical Decision Making/ A&P                           Medical Decision Making Risk Prescription drug management.   Patient presents with sore throat and low-grade fever, Tylenol given in triage.  Strep test ordered and reviewed results independently and positive.  Discussed oral antibiotics and supportive care.  No signs of peritonsillar abscess or other deep space infection.  Patient stable for discharge.        Final Clinical Impression(s) / ED Diagnoses Final diagnoses:  Strep pharyngitis    Rx / DC Orders ED Discharge Orders          Ordered    amoxicillin (AMOXIL) 500 MG capsule  Daily        05/17/22 1555              Elnora Morrison, MD 05/17/22 1600

## 2022-05-17 NOTE — ED Triage Notes (Signed)
Pt complains of sore throat that started yesterday. Denies congestion. Pt states she is having cold and hot chills.

## 2022-05-17 NOTE — ED Provider Triage Note (Signed)
Emergency Medicine Provider Triage Evaluation Note  [name] , a 36 y.o. female  was evaluated in triage.  Pt complains of sore throat since the middle of the night last night. She reports some chills, but has not recorded a temperature.    Review of Systems  Positive:  Negative:   Physical Exam  BP (!) 171/106 (BP Location: Left Arm)   Pulse 100   Temp (!) 100.4 F (38 C) (Oral)   Resp 16   SpO2 98%  Gen:   Awake, no distress   Resp:  Normal effort  MSK:   Moves extremities without difficulty  Other:  Tonsils 1+ bilaterally with exudate. Controlling secretions. Uvula midline.   Medical Decision Making  Medically screening exam initiated at 1:56 PM.  Appropriate orders placed.  [name] was informed that the remainder of the evaluation will be completed by another provider, this initial triage assessment does not replace that evaluation, and the importance of remaining in the ED until their evaluation is complete.  Fever in triage. Ordered Tylenol. Strep test pending.    Sierra Puller, [name] 05/17/22 1401

## 2023-04-07 ENCOUNTER — Emergency Department (HOSPITAL_BASED_OUTPATIENT_CLINIC_OR_DEPARTMENT_OTHER): Payer: Self-pay

## 2023-04-07 ENCOUNTER — Emergency Department (HOSPITAL_BASED_OUTPATIENT_CLINIC_OR_DEPARTMENT_OTHER)
Admission: EM | Admit: 2023-04-07 | Discharge: 2023-04-07 | Disposition: A | Discharge status: Home / Self Care | Payer: Self-pay | Attending: Emergency Medicine | Admitting: Emergency Medicine

## 2023-04-07 ENCOUNTER — Encounter (HOSPITAL_BASED_OUTPATIENT_CLINIC_OR_DEPARTMENT_OTHER): Payer: Self-pay | Admitting: Emergency Medicine

## 2023-04-07 ENCOUNTER — Other Ambulatory Visit: Payer: Self-pay

## 2023-04-07 DIAGNOSIS — X58XXXA Exposure to other specified factors, initial encounter: Secondary | ICD-10-CM | POA: Insufficient documentation

## 2023-04-07 DIAGNOSIS — Y990 Civilian activity done for income or pay: Secondary | ICD-10-CM | POA: Insufficient documentation

## 2023-04-07 DIAGNOSIS — S8392XA Sprain of unspecified site of left knee, initial encounter: Secondary | ICD-10-CM | POA: Insufficient documentation

## 2023-04-07 MED ORDER — DICLOFENAC SODIUM 75 MG PO TBEC: 75.0000 mg | DELAYED_RELEASE_TABLET | Freq: Two times a day (BID) | ORAL | 0 refills | Status: AC

## 2023-04-07 NOTE — ED Provider Notes (Signed)
[facility] Provider Note   CSN: [id number] Arrival date & time: 04/07/23  1658     History  Chief Complaint  Patient presents with   Knee Pain    [name] is a 37 y.o. female.  Patient complains of pain in her left knee.  Patient denies any injuries.  Patient reports that she stands at work.  Patient complains of swelling in the area below her left knee line.  Reports pain with walking she has not tried any medication.  The history is provided by the patient. No language interpreter was used.  Knee Pain      Home Medications Prior to Admission medications   Medication Sig Start Date End Date Taking? Authorizing Provider  diclofenac (VOLTAREN) 75 MG EC tablet Take 1 tablet (75 mg total) by mouth 2 (two) times daily. 04/07/23  Yes Elson Areas, PA-C  acetaminophen (TYLENOL) 325 MG tablet Take 1 tablet (325 mg total) by mouth every 6 (six) hours as needed. 01/10/15   Sciacca, Marissa, PA-C  diphenhydrAMINE (BENADRYL) 25 MG tablet Take 25 mg by mouth every 8 (eight) hours as needed for allergies.    [provider]  labetalol (NORMODYNE) 200 MG tablet Take 1 tablet (200 mg total) by mouth 2 (two) times daily. 03/30/20   Meisinger, Tawanna Cooler, MD  NIFEdipine (ADALAT CC) 90 MG 24 hr tablet Take 1 tablet (90 mg total) by mouth daily. 03/17/20   Judeth Horn, NP  oxyCODONE (OXY IR/ROXICODONE) 5 MG immediate release tablet Take 1-2 tablets (5-10 mg total) by mouth every 4 (four) hours as needed for severe pain. 03/30/20   Meisinger, Tawanna Cooler, MD  Prenatal Vit-Fe Fumarate-FA (PRENATAL COMPLETE) 14-0.4 MG TABS Take 1 tablet by mouth daily. 02/22/18   Garlon Hatchet, PA-C      Allergies    Patient has no known allergies.    Review of Systems   Review of Systems  All other systems reviewed and are negative.   Physical Exam Updated Vital Signs BP (!) 162/110 (BP Location: Left Arm)   Pulse 85   Temp 98.5 F (36.9 C) (Oral)    Resp 16   Ht 5\' 6"  (1.676 m)   Wt 99.8 kg   LMP 03/16/2023 (Approximate)   SpO2 99%   BMI 35.51 kg/m  Physical Exam Vitals and nursing note reviewed.  Constitutional:      Appearance: She is well-developed.  HENT:     Head: Normocephalic.  Cardiovascular:     Rate and Rhythm: Normal rate.  Pulmonary:     Effort: Pulmonary effort is normal.  Abdominal:     General: There is no distension.  Musculoskeletal:        General: Swelling and tenderness present.     Cervical back: Normal range of motion.     Comments: Tender medial area below midline knee full range of motion neurovascular neurosensory are intact  Skin:    General: Skin is warm.  Neurological:     General: No focal deficit present.     Mental Status: She is alert and oriented to person, place, and time.     ED Results / Procedures / Treatments   Labs (all labs ordered are listed, but only abnormal results are displayed) Labs Reviewed - No data to display  EKG None  Radiology DG Knee Complete 4 Views Left  Result Date: 04/07/2023 CLINICAL DATA:  Left knee pain for 2 weeks, worse last night. EXAM: LEFT  KNEE - COMPLETE 4+ VIEW COMPARISON:  None Available. FINDINGS: No evidence of fracture, dislocation, or joint effusion. No evidence of arthropathy. Sclerotic focus in the distal femoral metaphysis has the appearance of an involuting nonossifying fibroma, benign needing no further imaging follow-up. No suspicious bone lesion or bone destruction. Soft tissues are unremarkable. IMPRESSION: No acute findings or explanation for pain. Electronically Signed   By: Narda Rutherford M.D.   On: 04/07/2023 17:46    Procedures Procedures    Medications Ordered in ED Medications - No data to display  ED Course/ Medical Decision Making/ A&P                             Medical Decision Making Complains of pain in her left knee for the past 2 weeks she denies any injury  Amount and/or Complexity of Data  Reviewed Radiology: ordered and independent interpretation performed. Decision-making details documented in ED Course.    Details: X-ray left knee shows no acute abnormality  Risk Prescription drug management. Risk Details: Patient placed in a knee sleeve she is advised to follow-up with orthopedist if pain persist patient is given a prescription for Voltaren.           Final Clinical Impression(s) / ED Diagnoses Final diagnoses:  Sprain of left knee, unspecified ligament, initial encounter    Rx / DC Orders ED Discharge Orders          Ordered    diclofenac (VOLTAREN) 75 MG EC tablet  2 times daily        04/07/23 1808          An After Visit Summary was printed and given to the patient.     Elson Areas, [place] 04/07/23 2312    Terald Sleeper, MD 04/08/23 1020

## 2023-04-07 NOTE — ED Triage Notes (Signed)
Left knee pain x 2 weeks. Worsened today. Reports "knot" on medial side of left knee. Limited ROM and unable to bear full weight. Pt ambulatory to triage, steady gait but with limp.

## 2023-04-07 NOTE — Discharge Instructions (Addendum)
Schedule to see the Orthopaedist for evaluation  

## 2023-04-07 NOTE — ED Notes (Signed)
Discharge paperwork reviewed entirely with patient, including follow up care. Pain was under control. No prescriptions were called in, but all questions were addressed.  Pt verbalized understanding as well as all parties involved. No questions or concerns voiced at the time of discharge. No acute distress noted.   Pt ambulated out to PVA without incident or assistance.  

## 2024-06-29 ENCOUNTER — Other Ambulatory Visit: Payer: Self-pay | Admitting: Nurse Practitioner

## 2024-06-29 DIAGNOSIS — Z1231 Encounter for screening mammogram for malignant neoplasm of breast: Secondary | ICD-10-CM

## 2024-07-04 ENCOUNTER — Other Ambulatory Visit: Payer: Self-pay | Admitting: Nurse Practitioner

## 2024-07-04 DIAGNOSIS — N6320 Unspecified lump in the left breast, unspecified quadrant: Secondary | ICD-10-CM

## 2024-07-06 ENCOUNTER — Other Ambulatory Visit: Payer: Self-pay

## 2024-07-10 ENCOUNTER — Other Ambulatory Visit: Payer: Self-pay | Admitting: Nurse Practitioner

## 2024-07-10 ENCOUNTER — Ambulatory Visit
Admission: RE | Admit: 2024-07-10 | Discharge: 2024-07-10 | Disposition: A | Discharge status: Home / Self Care | Source: Ambulatory Visit | Attending: Nurse Practitioner

## 2024-07-10 ENCOUNTER — Other Ambulatory Visit: Payer: Self-pay

## 2024-07-10 ENCOUNTER — Ambulatory Visit
Admission: RE | Admit: 2024-07-10 | Discharge: 2024-07-10 | Disposition: A | Discharge status: Home / Self Care | Payer: Self-pay | Source: Ambulatory Visit | Attending: Nurse Practitioner | Admitting: Nurse Practitioner

## 2024-07-10 DIAGNOSIS — N6320 Unspecified lump in the left breast, unspecified quadrant: Secondary | ICD-10-CM

## 2024-07-13 ENCOUNTER — Other Ambulatory Visit

## 2024-07-17 ENCOUNTER — Ambulatory Visit
Admission: RE | Admit: 2024-07-17 | Discharge: 2024-07-17 | Disposition: A | Discharge status: Home / Self Care | Source: Ambulatory Visit | Attending: Nurse Practitioner | Admitting: Nurse Practitioner

## 2024-07-17 DIAGNOSIS — N6320 Unspecified lump in the left breast, unspecified quadrant: Secondary | ICD-10-CM

## 2024-07-17 HISTORY — PX: BREAST BIOPSY: SHX20

## 2024-07-18 LAB — SURGICAL PATHOLOGY
